# Patient Record
Sex: Female | Born: 1985
Health system: Southern US, Community
[De-identification: ages and names within clinical notes are randomized; demographics above are authoritative.]

## PROBLEM LIST (undated history)

## (undated) DIAGNOSIS — J45909 Unspecified asthma, uncomplicated: Secondary | ICD-10-CM

## (undated) DIAGNOSIS — J302 Other seasonal allergic rhinitis: Secondary | ICD-10-CM

## (undated) DIAGNOSIS — L0291 Cutaneous abscess, unspecified: Secondary | ICD-10-CM

## (undated) HISTORY — PX: TUBAL LIGATION: SHX77

---

## 2006-09-08 ENCOUNTER — Emergency Department (HOSPITAL_COMMUNITY): Admission: EM | Admit: 2006-09-08 | Discharge: 2006-09-08 | Payer: Self-pay | Admitting: Emergency Medicine

## 2006-09-11 ENCOUNTER — Inpatient Hospital Stay (HOSPITAL_COMMUNITY): Admission: AD | Admit: 2006-09-11 | Discharge: 2006-09-11 | Payer: Self-pay | Admitting: Obstetrics & Gynecology

## 2010-11-02 LAB — URINALYSIS, ROUTINE W REFLEX MICROSCOPIC
Glucose, UA: NEGATIVE
Ketones, ur: NEGATIVE
Leukocytes, UA: NEGATIVE
Nitrite: NEGATIVE
Protein, ur: 30 — AB
Specific Gravity, Urine: >1.03 — ABNORMAL HIGH
Urobilinogen, UA: 1
pH: 6

## 2010-11-02 LAB — CULTURE, ROUTINE-ABSCESS

## 2010-11-02 LAB — POCT PREGNANCY, URINE
Operator id: 26670
Operator id: 294501
Preg Test, Ur: POSITIVE
Preg Test, Ur: POSITIVE

## 2010-11-02 LAB — CBC
HCT: 40.6
Hemoglobin: 13.7
MCHC: 33.8
MCV: 80.2
Platelets: 194
RBC: 5.06
RDW: 14.6 — ABNORMAL HIGH
WBC: 4.4

## 2010-11-02 LAB — URINE MICROSCOPIC-ADD ON

## 2010-11-02 LAB — HCG, QUANTITATIVE, PREGNANCY: hCG, Beta Chain, Quant, S: 358 — ABNORMAL HIGH

## 2010-11-02 LAB — GC/CHLAMYDIA PROBE AMP, GENITAL
Chlamydia, DNA Probe: NEGATIVE
GC Probe Amp, Genital: NEGATIVE

## 2010-11-02 LAB — WET PREP, GENITAL
Clue Cells Wet Prep HPF POC: NONE SEEN
Trich, Wet Prep: NONE SEEN

## 2010-11-02 LAB — ABO/RH: ABO/RH(D): O POS

## 2017-11-09 ENCOUNTER — Other Ambulatory Visit: Payer: Self-pay

## 2017-11-09 ENCOUNTER — Encounter (HOSPITAL_BASED_OUTPATIENT_CLINIC_OR_DEPARTMENT_OTHER): Payer: Self-pay | Admitting: *Deleted

## 2017-11-09 ENCOUNTER — Emergency Department (HOSPITAL_BASED_OUTPATIENT_CLINIC_OR_DEPARTMENT_OTHER)
Admission: EM | Admit: 2017-11-09 | Discharge: 2017-11-09 | Disposition: A | Discharge status: Home / Self Care | Payer: Self-pay | Attending: Emergency Medicine | Admitting: Emergency Medicine

## 2017-11-09 DIAGNOSIS — N611 Abscess of the breast and nipple: Secondary | ICD-10-CM | POA: Insufficient documentation

## 2017-11-09 MED ORDER — SULFAMETHOXAZOLE-TRIMETHOPRIM 800-160 MG PO TABS: 1.0000 | ORAL_TABLET | Freq: Two times a day (BID) | ORAL | 0 refills | Status: AC

## 2017-11-09 NOTE — ED Notes (Signed)
Rx x 1 given for bactrim

## 2017-11-09 NOTE — ED Triage Notes (Signed)
Pt reports right breast swelling and tenderness x 1 week. States that she is on her period at this time. Denies discharge from nipple

## 2017-11-09 NOTE — ED Notes (Signed)
Right breast marked with marking pen, area of abscess, by PA

## 2017-11-09 NOTE — Discharge Instructions (Addendum)
You were seen in the ER for right breast pain.  There is some redness and swelling around the nipple.  Ultrasound showed a collection of fluid that could be blood or pus.  This could be from an abscess (boil) or an inflamed and infected duct in the breast.  Both are treated similarly.   You declined drainage today.  We will treat you with antibiotics.  600 mg ibuprofen every 6 hours, you can add 500 to 1000 mg of acetaminophen (Tylenol) every 6 hours for more pain control.  Warm compresses and light massage is very important to help with the abscess.  Monitor your symptoms, return to the ER if the redness has spread past the marker lines in 48 hours of taking antibiotics.  Return sooner if there is fevers greater than 100.54F, chills, nipple drainage.

## 2017-11-09 NOTE — ED Provider Notes (Signed)
[facility] EMERGENCY DEPARTMENT Provider Note   CSN: [id number] Arrival date & time: 11/09/17  1240     History   Chief Complaint Chief Complaint  Patient presents with  . Breast Pain    HPI Jacqueline Hill is a 32 y.o. female here for evaluation of right breast pain.  Onset 1 week ago, gradually worsening.  Associated with redness surrounding the nipple.  She denies fevers, chills, nipple discharge, trauma.  No recent pregnancy or breast-feeding.  She has been taken ibuprofen with mild relief.  Aggravated with direct palpation and wearing a bra.  HPI  History reviewed. No pertinent past medical history.  There are no active problems to display for this patient.   History reviewed. No pertinent surgical history.   OB History   None      Home Medications    Prior to Admission medications   Medication Sig Start Date End Date Taking? Authorizing Provider  sulfamethoxazole-trimethoprim (BACTRIM DS,SEPTRA DS) 800-160 MG tablet Take 1 tablet by mouth 2 (two) times daily for 7 days. 11/09/17 11/16/17  Liberty Handy, PA-C    Family History History reviewed. No pertinent family history.  Social History Social History   Tobacco Use  . Smoking status: Never Smoker  . Smokeless tobacco: Never Used  Substance Use Topics  . Alcohol use: Not Currently  . Drug use: Not Currently     Allergies   Patient has no known allergies.   Review of Systems Review of Systems  Respiratory:       Breast pain R  Skin: Positive for color change.  All other systems reviewed and are negative.    Physical Exam Updated Vital Signs BP 118/88 (BP Location: Left Arm)   Pulse 70   Temp 98.8 F (37.1 C) (Oral)   Resp 18   Ht 5\' 1"  (1.549 m)   Wt 45.4 kg   LMP 11/08/2017   SpO2 100%   BMI 18.89 kg/m   Physical Exam  Constitutional: She is oriented to person, place, and time. She appears well-developed and well-nourished. No distress.  NAD.  HENT:  Head:  Normocephalic and atraumatic.  Right Ear: External ear normal.  Left Ear: External ear normal.  Nose: Nose normal.  Eyes: Conjunctivae and EOM are normal. No scleral icterus.  Neck: Normal range of motion. Neck supple.  Cardiovascular: Normal rate, regular rhythm and normal heart sounds.  Pulmonary/Chest: Effort normal and breath sounds normal.  Musculoskeletal: Normal range of motion. She exhibits no deformity.  Neurological: She is alert and oriented to person, place, and time.  Skin: Skin is warm and dry. Capillary refill takes less than 2 seconds.  Mild erythema around upper part of right areola. approx 2 x 4 cm area of induration at 12 o'clock on areola.  Exquisite tenderness. No nipple discharge  Psychiatric: She has a normal mood and affect. Her behavior is normal. Judgment and thought content normal.  Nursing note and vitals reviewed.    ED Treatments / Results  Labs (all labs ordered are listed, but only abnormal results are displayed) Labs Reviewed - No data to display  EKG None  Radiology No results found.  Procedures Procedures (including critical care time)  EMERGENCY DEPARTMENT US SOFT TISSUE INTERPRETATION "Study: Limited Soft Tissue Ultrasound"  INDICATIONS: cellulitis Multiple views of the body part were obtained in real-time with a multi-frequency linear probe  PERFORMED BY: Myself IMAGES ARCHIVED?: Yes SIDE:Right  BODY PART:Breast INTERPRETATION:  Cellulitis present and fluid  Medications Ordered in ED Medications - No data to display   Initial Impression / Assessment and Plan / ED Course  I have reviewed the triage vital signs and the nursing notes.  Pertinent labs & imaging results that were available during my care of the patient were reviewed by me and considered in my medical decision making (see chart for details).     Concern for cellulitis versus abscess versus inflamed/infected duct.  She has no nipple discharge.  Malignancy is  less likely.  No systemic symptoms of infection such as fevers, chills.  Bedside ultrasound performed that suggested a collection of fluid at around 12:00 on the areola.  She has mild surrounding cellulitis as well.  Given location, I recommended needle aspiration.  I discussed the benefits and the risks of the procedure.  Patient ultimately declined because she was afraid of needles.  She understands that this may cause prolongation of infection or worsening infection.  I do not think emergent lab work or formal imaging is indicated today.  She is immunocompetent.  She is appropriate for outpatient management of small right breast abscess/cellulitis with oral antibiotics, warm compresses, high-dose NSAIDs.  I have marked the area of cellulitis, she is aware she needs to monitor the redness in the next 48 hours after antibiotics.  She is to return for fevers or expanding cellulitis, nipple discharge.  Patient is comfortable with this plan.  Final Clinical Impressions(s) / ED Diagnoses   Final diagnoses:  Breast abscess of female    ED Discharge Orders         Ordered    sulfamethoxazole-trimethoprim (BACTRIM DS,SEPTRA DS) 800-160 MG tablet  2 times daily     11/09/17 1427           Jerrell Mylar 11/09/17 1455    Linwood Dibbles, MD 11/10/17 2118

## 2018-03-21 ENCOUNTER — Other Ambulatory Visit: Payer: Self-pay

## 2018-03-21 ENCOUNTER — Encounter (HOSPITAL_BASED_OUTPATIENT_CLINIC_OR_DEPARTMENT_OTHER): Payer: Self-pay

## 2018-03-21 ENCOUNTER — Emergency Department (HOSPITAL_BASED_OUTPATIENT_CLINIC_OR_DEPARTMENT_OTHER)
Admission: EM | Admit: 2018-03-21 | Discharge: 2018-03-21 | Disposition: A | Discharge status: Home / Self Care | Payer: Self-pay | Attending: Emergency Medicine | Admitting: Emergency Medicine

## 2018-03-21 DIAGNOSIS — K029 Dental caries, unspecified: Secondary | ICD-10-CM | POA: Insufficient documentation

## 2018-03-21 DIAGNOSIS — K0889 Other specified disorders of teeth and supporting structures: Secondary | ICD-10-CM

## 2018-03-21 MED ORDER — PENICILLIN V POTASSIUM 500 MG PO TABS: 500.0000 mg | ORAL_TABLET | Freq: Four times a day (QID) | ORAL | 0 refills | Status: AC

## 2018-03-21 MED ORDER — HYDROCODONE-ACETAMINOPHEN 5-325 MG PO TABS
2.0000 | ORAL_TABLET | Freq: Once | ORAL | Status: AC
  Administered 2018-03-21: 2 via ORAL
  Filled 2018-03-21: qty 2

## 2018-03-21 MED ORDER — CHLORHEXIDINE GLUCONATE 0.12 % MT SOLN: 15.0000 mL | Freq: Two times a day (BID) | OROMUCOSAL | 0 refills | Status: DC

## 2018-03-21 MED ORDER — PENICILLIN V POTASSIUM 250 MG PO TABS
500.0000 mg | ORAL_TABLET | Freq: Once | ORAL | Status: AC
  Administered 2018-03-21: 500 mg via ORAL
  Filled 2018-03-21: qty 2

## 2018-03-21 NOTE — Discharge Instructions (Addendum)
You have been diagnosed today with Dental Pain.  At this time there does not appear to be the presence of an emergent medical condition, however there is always the potential for conditions to change. Please read and follow the below instructions.  Please return to the Emergency Department immediately for any new or worsening symptoms. Please be sure to follow up with your Primary Care Provider within one week regarding your visit today; please call their office to schedule an appointment even if you are feeling better for a follow-up visit. Please take the antibiotic penicillin as prescribed to avoid infection. You may use the Peridex mouth rinse as prescribed to help keep your mouth clean.  Do not swallow Peridex. You may take Ibuprofen (Advil, motrin) and Tylenol (acetaminophen) to relieve your pain.  You may take up to 400 MG (2 pills) of normal strength ibuprofen every 8 hours as needed.  In between doses of ibuprofen you make take tylenol, up to 500 mg (one extra strength pill).  Do not take more than 3,000 mg tylenol in a 24 hour period.  Please check all medication labels as many medications such as pain and cold medications may contain tylenol.  Do not drink alcohol while taking these medications.  Do not take other NSAID'S while taking ibuprofen (such as aleve or naproxen).  Please take ibuprofen with food to decrease stomach upset. You may use heating pads and ice packs to help with your dental pain. Please follow-up with a dentist as soon as possible for further evaluation and treatment of your dental pain.  Please see the resources below to find a dentist.  Get help right away if you: Are unable to open your mouth. Are having trouble breathing or swallowing. Have a fever. Notice that your face, neck, or jaw is swollen. Any new or concerning symptoms  Please read the additional information packets attached to your discharge summary.  Do not take your medicine if  develop an itchy  rash, swelling in your mouth or lips, or difficulty breathing.  ------------------------------------------ Dental Assistance  If unable to pay or uninsured, contact:  [facility] or [facility]. to become qualified for the adult dental clinic.  Patients with Medicaid: [facility] [phone] [address], [phone] [address], [phone]  If unable to pay, or uninsured, contact [facility] [phone]) or [facility] Department [phone] in [place], [phone] in [facility]) to become qualified for the adult dental clinic   Other Low-Cost Community Dental Services: [facility]- [address] [place], [place], [place], [address], [phone], Ext. [phone], 2nd and 4th Thursday of the month at 6:30am.  10 clients each day by appointment, can sometimes see walk-in patients if someone does not show for an appointment. [facility]- [address] [place], [place], [address], [phone] [facility] [address] [place], [place], [address], [phone] [facility]- [phone] [facility] Health Department- [phone] [facility] [phone]

## 2018-03-21 NOTE — ED Triage Notes (Signed)
Right lower dental pain since this morning.

## 2018-03-21 NOTE — ED Provider Notes (Signed)
[facility] EMERGENCY DEPARTMENT Provider Note   CSN: [id number] Arrival date & time: 03/21/18  1007    History   Chief Complaint Chief Complaint  Patient presents with  . Dental Pain    HPI Jacqueline Hill is a 33 y.o. female presenting today for right lower dental pain that began this morning.  Patient describes her pain as a severe intensity throbbing pain constant worsened with chewing on her right side and without alleviating factor.  Patient has not used any medication prior to arrival.  Patient denies history of fever, facial swelling, feeling of throat closing, difficulty breathing or swallowing, neck swelling or any additional concerns.   Patient states that she is an otherwise healthy 33 year old female without chronic medical conditions or immunocompromising diseases.  She states that her tubes are tied and denies chance of pregnancy today.    HPI  History reviewed. No pertinent past medical history.  There are no active problems to display for this patient.   History reviewed. No pertinent surgical history.   OB History   No obstetric history on file.      Home Medications    Prior to Admission medications   Medication Sig Start Date End Date Taking? Authorizing Provider  chlorhexidine (PERIDEX) 0.12 % solution Use as directed 15 mLs in the mouth or throat 2 (two) times daily. Do NOT swallow 03/21/18   Harlene Salts A, PA-C  penicillin v potassium (VEETID) 500 MG tablet Take 1 tablet (500 mg total) by mouth 4 (four) times daily for 7 days. 03/21/18 03/28/18  Bill Salinas, PA-C    Family History No family history on file.  Social History Social History   Tobacco Use  . Smoking status: Never Smoker  . Smokeless tobacco: Never Used  Substance Use Topics  . Alcohol use: Not Currently  . Drug use: Not Currently     Allergies   Patient has no known allergies.   Review of Systems Review of Systems  Constitutional: Negative.   Negative for chills and fever.  HENT: Positive for dental problem. Negative for drooling, facial swelling, sore throat, trouble swallowing and voice change.   Eyes: Negative.  Negative for pain and visual disturbance.  Gastrointestinal: Negative.  Negative for abdominal pain, nausea and vomiting.  Musculoskeletal: Negative.  Negative for neck pain and neck stiffness.   Physical Exam Updated Vital Signs BP 133/87 (BP Location: Left Arm)   Pulse 84   Temp 98.3 F (36.8 C) (Oral)   Resp 16   Ht 5\' 1"  (1.549 m)   Wt 50.3 kg   LMP 02/21/2018   SpO2 99%   BMI 20.97 kg/m   Physical Exam Constitutional:      General: She is not in acute distress.    Appearance: Normal appearance. She is well-developed. She is not ill-appearing or diaphoretic.  HENT:     Head: Normocephalic and atraumatic.     Jaw: There is normal jaw occlusion. No trismus.     Right Ear: Tympanic membrane, ear canal and external ear normal.     Left Ear: Tympanic membrane, ear canal and external ear normal.     Nose: Nose normal.     Mouth/Throat:     Mouth: Mucous membranes are moist.     Pharynx: Oropharynx is clear.      Comments: Multiple dental caries and poor dentition overall.  Patient's pain centers around tooth 29.  The patient has normal phonation and is in control of secretions. No stridor.  Midline uvula without edema. Soft palate rises symmetrically. No tonsillar erythema, swelling or exudates. Tongue protrusion is normal, floor of mouth is soft. No trismus. No creptius or swelling on neck palpation. No gingival erythema or fluctuance noted. Mucus membranes moist. Eyes:     General: Vision grossly intact. Gaze aligned appropriately.     Extraocular Movements: Extraocular movements intact.     Conjunctiva/sclera: Conjunctivae normal.     Pupils: Pupils are equal, round, and reactive to light.     Comments: No pain with extraocular motion  Neck:     Musculoskeletal: Full passive range of motion without  pain, normal range of motion and neck supple. No neck rigidity, crepitus or pain with movement.     Trachea: Trachea and phonation normal. No tracheal tenderness or tracheal deviation.  Pulmonary:     Effort: Pulmonary effort is normal. No respiratory distress.  Abdominal:     General: There is no distension.     Palpations: Abdomen is soft.     Tenderness: There is no abdominal tenderness. There is no guarding or rebound.  Musculoskeletal: Normal range of motion.  Skin:    General: Skin is warm and dry.  Neurological:     Mental Status: She is alert.     GCS: GCS eye subscore is 4. GCS verbal subscore is 5. GCS motor subscore is 6.     Comments: Speech is clear and goal oriented, follows commands Major Cranial nerves without deficit, no facial droop Moves extremities without ataxia, coordination intact Normal gait  Psychiatric:        Behavior: Behavior normal.    ED Treatments / Results  Labs (all labs ordered are listed, but only abnormal results are displayed) Labs Reviewed - No data to display  EKG None  Radiology No results found.  Procedures Procedures (including critical care time)  Medications Ordered in ED Medications  HYDROcodone-acetaminophen (NORCO/VICODIN) 5-325 MG per tablet 2 tablet (2 tablets Oral Given 03/21/18 1028)  penicillin v potassium (VEETID) tablet 500 mg (500 mg Oral Given 03/21/18 1029)     Initial Impression / Assessment and Plan / ED Course  I have reviewed the triage vital signs and the nursing notes.  Pertinent labs & imaging results that were available during my care of the patient were reviewed by me and considered in my medical decision making (see chart for details).     33 year old otherwise healthy female presents today for dental pain that began this morning.  Patient with poor dentition overall and multiple dental cavities.  Patient with pain to percussion of tooth 29.  Patient without signs or symptoms of dental abscess, no  swelling/erythema/tenderness of the gums.  Patient is overall very well-appearing, afebrile, nontoxic and speaking well.  She is able to swallow without pain.  No signs of swelling or concern for Ludwig's angina, peritonsillar abscess, retropharyngeal abscess or other deep tissue infections of the head or neck.  No pain with extraocular motion and no signs of preseptal or orbital cellulitis. No sign of swelling of the neck, patient has good range of motion of the neck, no trismus.  Patient's husband is here to drive her home today.  She has been given 2 Norco here in the emergency department for pain. She has been informed not to drive after pain medicine today.  Will treat with penicillin, Peridex and OTC anti-inflammatories.  Patient urged to follow-up with a dentist.  At this time there does not appear to be any evidence of an acute  emergency medical condition and the patient appears stable for discharge with appropriate outpatient follow up. Diagnosis was discussed with patient who verbalizes understanding of care plan and is agreeable to discharge. I have discussed return precautions with patient and husband who verbalize understanding of return precautions. Patient strongly encouraged to follow-up with their PCP and dentis. All questions answered.   Note: Portions of this report may have been transcribed using voice recognition software. Every effort was made to ensure accuracy; however, inadvertent computerized transcription errors may still be present.  Final Clinical Impressions(s) / ED Diagnoses   Final diagnoses:  Pain, dental    ED Discharge Orders         Ordered    penicillin v potassium (VEETID) 500 MG tablet  4 times daily     03/21/18 1032    chlorhexidine (PERIDEX) 0.12 % solution  2 times daily     03/21/18 [address] 03/21/18 1037    Pricilla Loveless, MD 03/21/18 1407

## 2018-03-21 NOTE — ED Notes (Signed)
ED Provider at bedside. 

## 2018-09-14 ENCOUNTER — Encounter (HOSPITAL_BASED_OUTPATIENT_CLINIC_OR_DEPARTMENT_OTHER): Payer: Self-pay | Admitting: *Deleted

## 2018-09-14 ENCOUNTER — Emergency Department (HOSPITAL_BASED_OUTPATIENT_CLINIC_OR_DEPARTMENT_OTHER)
Admission: EM | Admit: 2018-09-14 | Discharge: 2018-09-14 | Disposition: A | Discharge status: Home / Self Care | Payer: Medicaid Other | Attending: Emergency Medicine | Admitting: Emergency Medicine

## 2018-09-14 ENCOUNTER — Other Ambulatory Visit: Payer: Self-pay

## 2018-09-14 DIAGNOSIS — M545 Low back pain, unspecified: Secondary | ICD-10-CM

## 2018-09-14 DIAGNOSIS — F17210 Nicotine dependence, cigarettes, uncomplicated: Secondary | ICD-10-CM | POA: Insufficient documentation

## 2018-09-14 LAB — URINALYSIS, ROUTINE W REFLEX MICROSCOPIC
Bilirubin Urine: NEGATIVE
Glucose, UA: NEGATIVE mg/dL
Hgb urine dipstick: NEGATIVE
Ketones, ur: NEGATIVE mg/dL
Leukocytes,Ua: NEGATIVE
Nitrite: NEGATIVE
Protein, ur: NEGATIVE mg/dL
Specific Gravity, Urine: 1.02 (ref 1.005–1.030)
pH: 7 (ref 5.0–8.0)

## 2018-09-14 LAB — PREGNANCY, URINE: Preg Test, Ur: NEGATIVE

## 2018-09-14 MED ORDER — METHOCARBAMOL 500 MG PO TABS: 500.0000 mg | ORAL_TABLET | Freq: Two times a day (BID) | ORAL | 0 refills | Status: DC

## 2018-09-14 MED FILL — METHOCARBAMOL 500 MG TABS: 500 | 8 days supply | Qty: 16 | Fill #0

## 2018-09-14 NOTE — ED Provider Notes (Signed)
[facility] EMERGENCY DEPARTMENT Provider Note   CSN: [id number] Arrival date & time: 09/14/18  1058     History   Chief Complaint Chief Complaint  Patient presents with   Back Pain    HPI Jacqueline Hill is a 33 y.o. female who presents for evaluation of right-sided back pain x3 days.  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  She does report that on Friday, prior to onset of pain, she did try and lift a heavy pallet by herself.  She reports since then, she has had pain to the right lower back.  This pain is worse with movement, bending down.  She has been able to ambulate but does report some worsening pain with ambulation.  She reports occasionally, this pain will radiate down into her buttock and leg.  She denies any numbness/weakness of her extremities.  She states that she is concerned she may have a "kidney infection or a urine infection" because her urine smelled.  She states she did not have any dysuria or hematuria that she is noted.  She did have some nausea and mild lower abdominal pain this morning but denies any vomiting.  She currently denies any abdominal pain at this time. Denies fevers, weight loss, numbness/weakness of upper and lower extremities, bowel/bladder incontinence, saddle anesthesia, history of back surgery, history of IVDA.      The history is provided by the patient.    History reviewed. No pertinent past medical history.  There are no active problems to display for this patient.   History reviewed. No pertinent surgical history.   OB History   No obstetric history on file.      Home Medications    Prior to Admission medications   Medication Sig Start Date End Date Taking? Authorizing Provider  chlorhexidine (PERIDEX) 0.12 % solution Use as directed 15 mLs in the mouth or throat 2 (two) times daily. Do NOT swallow 03/21/18   Harlene SaltsMorelli, Brandon A, PA-C    methocarbamol (ROBAXIN) 500 MG tablet Take 1 tablet (500 mg total) by mouth 2 (two) times daily. 09/14/18   Maxwell CaulLayden, Jolly Bleicher A, PA-C    Family History No family history on file.  Social History Social History   Tobacco Use   Smoking status: Current Every Day Smoker   Smokeless tobacco: Never Used  Substance Use Topics   Alcohol use: Yes   Drug use: Yes    Types: Marijuana     Allergies   Patient has no known allergies.   Review of Systems Review of Systems  Constitutional: Negative for fever.  Genitourinary: Negative for dysuria and hematuria.  Musculoskeletal: Positive for back pain.  Neurological: Negative for weakness and numbness.  All other systems reviewed and are negative.    Physical Exam Updated Vital Signs BP 124/82 (BP Location: Right Arm)    Pulse 79    Temp 98.1 F (36.7 C) (Oral)    Resp 16    Ht 5\' 1"  (1.549 m)    Wt 57.6 kg    SpO2 100%    BMI 24.00 kg/m   Physical Exam Vitals signs and nursing note reviewed.  Constitutional:      Appearance: Normal appearance. She is well-developed.  HENT:     Head: Normocephalic and atraumatic.  Eyes:     General: Lids are normal.     Conjunctiva/sclera: Conjunctivae normal.     Pupils: Pupils are  equal, round, and reactive to light.  Neck:     Musculoskeletal: Full passive range of motion without pain.     Comments: Full flexion/extension and lateral movement of neck fully intact. No bony midline tenderness. No deformities or crepitus.  Cardiovascular:     Rate and Rhythm: Normal rate and regular rhythm.     Pulses: Normal pulses.     Heart sounds: Normal heart sounds. No murmur. No friction rub. No gallop.   Pulmonary:     Effort: Pulmonary effort is normal.  Abdominal:     Palpations: Abdomen is soft. Abdomen is not rigid.     Tenderness: There is no abdominal tenderness. There is no right CVA tenderness, left CVA tenderness or guarding.     Comments: Abdomen is soft, non-distended, non-tender. No  rigidity, No guarding. No peritoneal signs.  No CVA tenderness bilaterally.  Musculoskeletal: Normal range of motion.     Thoracic back: She exhibits no tenderness.     Lumbar back: She exhibits no tenderness.       Back:     Comments: No midline T or L-spine tenderness.  No deformity or crepitus noted.  Diffuse tenderness palpation noted to the paraspinal muscles of the right lower lumbar region.  Skin:    General: Skin is warm and dry.     Capillary Refill: Capillary refill takes less than 2 seconds.  Neurological:     Mental Status: She is alert and oriented to person, place, and time.     Comments: Follows commands, Moves all extremities  5/5 strength to BUE and BLE  Sensation intact throughout all major nerve distributions Negative straight leg raise.  Psychiatric:        Speech: Speech normal.      ED Treatments / Results  Labs (all labs ordered are listed, but only abnormal results are displayed) Labs Reviewed  URINALYSIS, ROUTINE W REFLEX MICROSCOPIC  PREGNANCY, URINE    EKG None  Radiology No results found.  Procedures Procedures (including critical care time)  Medications Ordered in ED Medications - No data to display   Initial Impression / Assessment and Plan / ED Course  I have reviewed the triage vital signs and the nursing notes.  Pertinent labs & imaging results that were available during my care of the patient were reviewed by me and considered in my medical decision making (see chart for details).        33 year old female who presents for evaluation of right lower back pain x3 days.  No history of trauma, injury but does do heavy lifting at work.  No dysuria or hematuria though has noted her urine smelled.  No fevers, vomiting.  Had mild episode of nausea and abdominal pain earlier today but that has since resolved. Patient is afebrile, non-toxic appearing, sitting comfortably on examination table. Vital signs reviewed and stable.  On exam, she  has tenderness palpation of the right paraspinal muscles of the lower lumbar region.  No midline bony tenderness.  No abdominal tenderness.  No neuro deficits on exam.  No red flags.  Exam consistent with musculoskeletal pain of her back.  History/physical exam not concerning for kidney stone.  Additionally, she has no abdominal tenderness with concerning for intra-abdominal process. History/physical exam is not concerning for cauda equina or spinal abscess.  Urine ordered at triage.  UA shows no evidence of infectious etiology.  Urine pregnancy negative.  This time, I believe her pain is most likely musculoskeletal in nature.  Will plan  to treat accordingly.  Patient with no known drug allergies. At this time, patient exhibits no emergent life-threatening condition that require further evaluation in ED or admission. Patient had ample opportunity for questions and discussion. All patient's questions were answered with full understanding. Strict return precautions discussed. Patient expresses understanding and agreement to plan.   Portions of this note were generated with Lobbyist. Dictation errors may occur despite best attempts at proofreading.   Final Clinical Impressions(s) / ED Diagnoses   Final diagnoses:  Acute right-sided low back pain, unspecified whether sciatica present    ED Discharge Orders         Ordered    methocarbamol (ROBAXIN) 500 MG tablet  2 times daily     09/14/18 1348           Desma Mcgregor 09/14/18 1906    Sherwood Gambler, MD 09/15/18 [phone]

## 2018-09-14 NOTE — Discharge Instructions (Signed)
You can take Tylenol or Ibuprofen as directed for pain. You can alternate Tylenol and Ibuprofen every 4 hours. If you take Tylenol at 1pm, then you can take Ibuprofen at 5pm. Then you can take Tylenol again at 9pm.   Take Robaxin as prescribed. This medication will make you drowsy so do not drive or drink alcohol when taking it.  Return to the Emergency Department immediately for any worsening back pain, neck pain, difficulty walking, numbness/weaknss of your arms or legs, urinary or bowel accidents, fever or any other worsening or concerning symptoms.  

## 2018-09-14 NOTE — ED Notes (Signed)
C/o rt lower back pain x 3 days  Denies inj  Pain increased w movement

## 2018-09-14 NOTE — ED Triage Notes (Signed)
Back pain x 3 days 

## 2018-12-19 ENCOUNTER — Other Ambulatory Visit: Payer: Self-pay

## 2018-12-19 ENCOUNTER — Emergency Department (HOSPITAL_BASED_OUTPATIENT_CLINIC_OR_DEPARTMENT_OTHER)
Admission: EM | Admit: 2018-12-19 | Discharge: 2018-12-19 | Disposition: A | Discharge status: Home / Self Care | Payer: Medicaid Other

## 2019-02-08 ENCOUNTER — Emergency Department (HOSPITAL_COMMUNITY)
Admission: EM | Admit: 2019-02-08 | Discharge: 2019-02-08 | Disposition: A | Discharge status: Home / Self Care | Payer: Medicaid Other

## 2019-03-09 ENCOUNTER — Emergency Department (HOSPITAL_COMMUNITY)
Admission: EM | Admit: 2019-03-09 | Discharge: 2019-03-09 | Disposition: A | Discharge status: Home / Self Care | Payer: Medicaid Other | Attending: Emergency Medicine | Admitting: Emergency Medicine

## 2019-03-09 ENCOUNTER — Other Ambulatory Visit: Payer: Self-pay

## 2019-03-09 DIAGNOSIS — F17210 Nicotine dependence, cigarettes, uncomplicated: Secondary | ICD-10-CM | POA: Insufficient documentation

## 2019-03-09 DIAGNOSIS — Z79899 Other long term (current) drug therapy: Secondary | ICD-10-CM | POA: Insufficient documentation

## 2019-03-09 DIAGNOSIS — R112 Nausea with vomiting, unspecified: Secondary | ICD-10-CM

## 2019-03-09 LAB — URINALYSIS, ROUTINE W REFLEX MICROSCOPIC
Glucose, UA: NEGATIVE mg/dL
Hgb urine dipstick: NEGATIVE
Ketones, ur: NEGATIVE mg/dL
Leukocytes,Ua: NEGATIVE
Nitrite: POSITIVE — AB
Protein, ur: NEGATIVE mg/dL
Specific Gravity, Urine: >1.03 — ABNORMAL HIGH (ref 1.005–1.030)
pH: 6 (ref 5.0–8.0)

## 2019-03-09 LAB — URINALYSIS, MICROSCOPIC (REFLEX)

## 2019-03-09 LAB — POC URINE PREG, ED: Preg Test, Ur: NEGATIVE

## 2019-03-09 MED ORDER — ONDANSETRON HCL 4 MG PO TABS: 4.0000 mg | ORAL_TABLET | Freq: Four times a day (QID) | ORAL | 0 refills | Status: DC

## 2019-03-09 MED ORDER — ONDANSETRON HCL 4 MG PO TABS: 4.0000 mg | ORAL_TABLET | Freq: Once | ORAL | Status: DC

## 2019-03-09 NOTE — Discharge Instructions (Signed)
Based on review of vitals, medical screening exam, lab work and/or imaging, there does not appear to be an acute, emergent etiology for the patient's symptoms. Counseled pt on good return precautions and encouraged both PCP and ED follow-up as needed.  Prior to discharge, I also discussed incidental imaging findings with patient in detail and advised appropriate, recommended follow-up in detail.  Clinical Impression: 1. Non-intractable vomiting with nausea, unspecified vomiting type     Disposition: Discharge  Prior to providing a prescription for a controlled substance, I independently reviewed the patient's recent prescription history on the [place] Controlled Substance Reporting System. The patient had no recent or regular prescriptions and was deemed appropriate for a brief, less than 3 day prescription of narcotic for acute analgesia.  This note was prepared with assistance of Conservation officer, historic buildings. Occasional wrong-word or sound-a-like substitutions may have occurred due to the inherent limitations of voice recognition software.

## 2019-03-09 NOTE — ED Provider Notes (Signed)
[facility]-EMERGENCY DEPT Provider Note   CSN: [id number] Arrival date & time: 03/09/19  1143     History No chief complaint on file.   Jacqueline Hill is a 34 y.o. female.  Patient is a 34 year old female with no past medical history presenting to the emergency department for pregnancy test.  Patient reports that she missed her period this month and she has had vomiting x5 since yesterday.  She denies any other symptoms.  Denies any abdominal pain, dysuria, vaginal discharge, pelvic pain, diarrhea, fever, cough or other URI symptoms.  Denies sick contacts.  Patient reports that she has had a tubal ligation in the past.        No past medical history on file.  There are no problems to display for this patient.   No past surgical history on file.   OB History   No obstetric history on file.     No family history on file.  Social History   Tobacco Use  . Smoking status: Current Every Day Smoker  . Smokeless tobacco: Never Used  Substance Use Topics  . Alcohol use: Yes  . Drug use: Yes    Types: Marijuana    Home Medications Prior to Admission medications   Medication Sig Start Date End Date Taking? Authorizing Provider  chlorhexidine (PERIDEX) 0.12 % solution Use as directed 15 mLs in the mouth or throat 2 (two) times daily. Do NOT swallow 03/21/18   Harlene Salts A, PA-C  methocarbamol (ROBAXIN) 500 MG tablet Take 1 tablet (500 mg total) by mouth 2 (two) times daily. 09/14/18   Maxwell Caul, PA-C    Allergies    Patient has no known allergies.  Review of Systems   Review of Systems  Constitutional: Negative for appetite change, chills, fatigue and fever.  HENT: Negative for congestion and sore throat.   Respiratory: Negative for cough and shortness of breath.   Cardiovascular: Negative for chest pain.  Gastrointestinal: Positive for nausea and vomiting. Negative for abdominal pain, constipation and diarrhea.  Genitourinary:  Positive for menstrual problem. Negative for decreased urine volume, dysuria, genital sores, pelvic pain, vaginal bleeding and vaginal discharge.  Musculoskeletal: Negative for arthralgias and myalgias.  Skin: Negative for rash.  Neurological: Negative for dizziness and light-headedness.    Physical Exam Updated Vital Signs BP 131/89 (BP Location: Left Arm)   Pulse (!) 110   Temp 98.4 F (36.9 C) (Oral)   Resp 17   Ht 5\' 1"  (1.549 m)   Wt 59 kg   LMP 02/07/2019   SpO2 100%   BMI 24.56 kg/m   Physical Exam Vitals and nursing note reviewed.  Constitutional:      General: She is not in acute distress.    Appearance: Normal appearance. She is not ill-appearing, toxic-appearing or diaphoretic.  HENT:     Head: Normocephalic.     Nose: Nose normal.     Mouth/Throat:     Mouth: Mucous membranes are moist.  Eyes:     Conjunctiva/sclera: Conjunctivae normal.  Cardiovascular:     Rate and Rhythm: Normal rate and regular rhythm.  Pulmonary:     Effort: Pulmonary effort is normal.     Breath sounds: Normal breath sounds.  Abdominal:     General: Abdomen is flat. There is no distension.     Palpations: There is no mass.     Tenderness: There is no abdominal tenderness. There is no right CVA tenderness, left CVA tenderness or guarding.  Skin:    General: Skin is dry.  Neurological:     Mental Status: She is alert.  Psychiatric:        Mood and Affect: Mood normal.     ED Results / Procedures / Treatments   Labs (all labs ordered are listed, but only abnormal results are displayed) Labs Reviewed  POC URINE PREG, ED    EKG None  Radiology No results found.  Procedures Procedures (including critical care time)  Medications Ordered in ED Medications  ondansetron (ZOFRAN) tablet 4 mg (has no administration in time range)    ED Course  I have reviewed the triage vital signs and the nursing notes.  Pertinent labs & imaging results that were available during my  care of the patient were reviewed by me and considered in my medical decision making (see chart for details).  Clinical Course as of Mar 08 1233  Tue Mar 09, 2019  1232 Patient with symptoms consistent with viral gastroenteritis.  Vitals are stable, no fever.  No signs of dehydration, tolerating PO fluids > 6 oz.  Lungs are clear.  No focal abdominal pain, no concern for appendicitis, cholecystitis, pancreatitis, ruptured viscus, UTI, kidney stone, or any other abdominal etiology.  Supportive therapy indicated with return if symptoms worsen.  Patient counseled.    [KM]    Clinical Course User Index [KM] Kristine Royal   MDM Rules/Calculators/A&P                      Based on review of vitals, medical screening exam, lab work and/or imaging, there does not appear to be an acute, emergent etiology for the patient's symptoms. Counseled pt on good return precautions and encouraged both PCP and ED follow-up as needed.  Prior to discharge, I also discussed incidental imaging findings with patient in detail and advised appropriate, recommended follow-up in detail.  Clinical Impression: 1. Non-intractable vomiting with nausea, unspecified vomiting type     Disposition: Discharge  Prior to providing a prescription for a controlled substance, I independently reviewed the patient's recent prescription history on the [place]. The patient had no recent or regular prescriptions and was deemed appropriate for a brief, less than 3 day prescription of narcotic for acute analgesia.  This note was prepared with assistance of Systems analyst. Occasional wrong-word or sound-a-like substitutions may have occurred due to the inherent limitations of voice recognition software.  Final Clinical Impression(s) / ED Diagnoses Final diagnoses:  None    Rx / DC Orders ED Discharge Orders    None       Kristine Royal 03/09/19  1235    Nat Christen, MD 03/09/19 [phone]

## 2019-03-09 NOTE — ED Triage Notes (Addendum)
Per patient, she has missed her period this month and has never missed a period, patient says she is afraid she may be pregnant. Says her tubes were tied in 2013, but she has never missed a period. Reports uterus pain of 7/10. Patient says she has been vomiting since last night.

## 2019-03-28 ENCOUNTER — Emergency Department (HOSPITAL_COMMUNITY)
Admission: EM | Admit: 2019-03-28 | Discharge: 2019-03-28 | Disposition: A | Discharge status: Home / Self Care | Payer: Medicaid Other | Attending: Emergency Medicine | Admitting: Emergency Medicine

## 2019-03-28 ENCOUNTER — Encounter (HOSPITAL_COMMUNITY): Payer: Self-pay | Admitting: *Deleted

## 2019-03-28 DIAGNOSIS — F17200 Nicotine dependence, unspecified, uncomplicated: Secondary | ICD-10-CM | POA: Insufficient documentation

## 2019-03-28 DIAGNOSIS — N611 Abscess of the breast and nipple: Secondary | ICD-10-CM

## 2019-03-28 HISTORY — DX: Cutaneous abscess, unspecified: L02.91

## 2019-03-28 MED ORDER — CEPHALEXIN 500 MG PO CAPS: 500.0000 mg | ORAL_CAPSULE | Freq: Four times a day (QID) | ORAL | 0 refills | Status: AC

## 2019-03-28 NOTE — ED Triage Notes (Signed)
Abscess on rt breast open and draining.

## 2019-03-28 NOTE — ED Provider Notes (Signed)
[place] DEPT Provider Note   CSN: [id number] Arrival date & time: 03/28/19  1119     History Chief Complaint  Patient presents with  . Abscess    Jacqueline Hill is a 34 y.o. female.  HPI   34 year old female presenting for evaluation of abscess to the right breast.  States that it has been present for the last several months however this morning she woke up and noticed that she had had some drainage.  She denies any associated symptoms including no fevers.  Denies any exacerbating or alleviating factors.  Past Medical History:  Diagnosis Date  . Abscess     There are no problems to display for this patient.   History reviewed. No pertinent surgical history.   OB History   No obstetric history on file.     No family history on file.  Social History   Tobacco Use  . Smoking status: Current Every Day Smoker  . Smokeless tobacco: Never Used  Substance Use Topics  . Alcohol use: Yes  . Drug use: Never    Types: Marijuana    Home Medications Prior to Admission medications   Medication Sig Start Date End Date Taking? Authorizing Provider  cephALEXin (KEFLEX) 500 MG capsule Take 1 capsule (500 mg total) by mouth 4 (four) times daily for 10 days. 03/28/19 04/07/19  Claron Rosencrans S, PA-C  chlorhexidine (PERIDEX) 0.12 % solution Use as directed 15 mLs in the mouth or throat 2 (two) times daily. Do NOT swallow 03/21/18   Nuala Alpha A, PA-C  methocarbamol (ROBAXIN) 500 MG tablet Take 1 tablet (500 mg total) by mouth 2 (two) times daily. 09/14/18   Volanda Napoleon, PA-C  ondansetron (ZOFRAN) 4 MG tablet Take 1 tablet (4 mg total) by mouth every 6 (six) hours. 03/09/19   Alveria Apley, PA-C    Allergies    Patient has no known allergies.  Review of Systems   Review of Systems  Constitutional: Negative for fever.  Respiratory: Negative for shortness of breath.   Cardiovascular: Negative for chest pain.  Skin: Positive for color  change.       Right breast abscess    Physical Exam Updated Vital Signs BP (!) 130/107 (BP Location: Left Arm)   Pulse 95   Temp 98.3 F (36.8 C) (Oral)   Resp 18   Ht 5\' 1"  (1.549 m)   SpO2 98%   BMI 24.56 kg/m   Physical Exam Vitals and nursing note reviewed.  Constitutional:      General: She is not in acute distress.    Appearance: She is well-developed.  HENT:     Head: Normocephalic and atraumatic.  Eyes:     Conjunctiva/sclera: Conjunctivae normal.  Cardiovascular:     Rate and Rhythm: Normal rate.  Pulmonary:     Effort: Pulmonary effort is normal.  Chest:     Breasts:        Right: No inverted nipple or nipple discharge.        Comments: Mild erythema, warmth and TTP noted above the right areola. There is a central area of fluctuance and scabbing noted. There is not drainage noted. Musculoskeletal:        General: Normal range of motion.     Cervical back: Neck supple.  Skin:    General: Skin is warm and dry.  Neurological:     Mental Status: She is alert.     ED Results / Procedures / Treatments  Labs (all labs ordered are listed, but only abnormal results are displayed) Labs Reviewed - No data to display  EKG None  Radiology No results found.  Procedures Procedures (including critical care time)  Medications Ordered in ED Medications - No data to display  ED Course  I have reviewed the triage vital signs and the nursing notes.  Pertinent labs & imaging results that were available during my care of the patient were reviewed by me and considered in my medical decision making (see chart for details).    MDM Rules/Calculators/A&P                      34 y/o F presenting for eval of breast abscess. No systemic sxs. Afebrile and nontoxic appearing here. Will start on abx. She does not have a pcp, will given information for her to go to the [facility] so that she can be referred to the breast center for imaging and  possible aspiration. I have started her on abx. Have advised her to f/u for new or worsening symptoms. Pt voices understanding and is in agreement with the plan. All questions answered, pt stable for d/c.   Final Clinical Impression(s) / ED Diagnoses Final diagnoses:  Breast abscess    Rx / DC Orders ED Discharge Orders         Ordered    cephALEXin (KEFLEX) 500 MG capsule  4 times daily     03/28/19 [address] Byanca Kasper S, PA-C 03/28/19 1157    Gwyneth Sprout, MD 03/28/19 1511

## 2019-03-28 NOTE — Discharge Instructions (Signed)
You were given a prescription for antibiotics. Please take the antibiotic prescription fully.   You will need to go to the [facility] to establish care and be referred to the Breast Center. Please go to the office tomorrow morning.  Please return to the emergency room immediately if you experience any new or worsening symptoms or any symptoms that indicate worsening infection such as fevers, increased redness/swelling/pain, warmth, or drainage from the affected area.

## 2019-03-29 ENCOUNTER — Ambulatory Visit (HOSPITAL_COMMUNITY)
Admission: EM | Admit: 2019-03-29 | Discharge: 2019-03-29 | Disposition: A | Discharge status: Home / Self Care | Payer: Medicaid Other | Attending: Family Medicine | Admitting: Family Medicine

## 2019-03-29 ENCOUNTER — Other Ambulatory Visit: Payer: Self-pay

## 2019-03-29 ENCOUNTER — Encounter (HOSPITAL_COMMUNITY): Payer: Self-pay | Admitting: Emergency Medicine

## 2019-03-29 DIAGNOSIS — N611 Abscess of the breast and nipple: Secondary | ICD-10-CM

## 2019-03-29 NOTE — Discharge Instructions (Addendum)
You must establish with a primary care doctor The primary care doctor can refer you to the breast center You cannot get referrals from urgent care or ER ( per medicaid) I have given you two numbers to call

## 2019-03-29 NOTE — ED Triage Notes (Signed)
Abscess on right breast. She was seen at [facility] yesterday and started on antibiotics. She feels like it needs I&D

## 2019-03-29 NOTE — ED Provider Notes (Signed)
Erma    CSN: [id number] Arrival date & time: 03/29/19  1002      History   Chief Complaint Chief Complaint  Patient presents with  . Abscess    HPI Jacqueline Hill is a 34 y.o. female.   HPI  Patient is here for her right breast abscess She was seen yesterday in the emergency department She was referred to primary care so she can go to the breast center for additional imaging She called the primary care and cannot be seen for a week, was discouraged, and felt like she needed care today She is taking the antibiotics She is having very little pain I reviewed her medical record.  She was first seen for an abscess in this exact area in october of 2019.  By her report this is the same problem.  I explained to the patient that same-day appointments were appropriate for emergencies, and that this is clearly a chronic problem. I also explained to her that ignoring this is not advisable.  She has a mass with drainage and dimpling that is been present for a long time.  She needs to get it taken care of.  Past Medical History:  Diagnosis Date  . Abscess     There are no problems to display for this patient.   History reviewed. No pertinent surgical history.  OB History   No obstetric history on file.      Home Medications    Prior to Admission medications   Medication Sig Start Date End Date Taking? Authorizing Provider  cephALEXin (KEFLEX) 500 MG capsule Take 1 capsule (500 mg total) by mouth 4 (four) times daily for 10 days. 03/28/19 04/07/19 Yes Couture, Cortni S, PA-C  chlorhexidine (PERIDEX) 0.12 % solution Use as directed 15 mLs in the mouth or throat 2 (two) times daily. Do NOT swallow 03/21/18   Nuala Alpha A, PA-C  methocarbamol (ROBAXIN) 500 MG tablet Take 1 tablet (500 mg total) by mouth 2 (two) times daily. 09/14/18   Volanda Napoleon, PA-C  ondansetron (ZOFRAN) 4 MG tablet Take 1 tablet (4 mg total) by mouth every 6 (six) hours. 03/09/19    Alveria Apley, PA-C    Family History No family history on file.  Social History Social History   Tobacco Use  . Smoking status: Current Every Day Smoker  . Smokeless tobacco: Never Used  Substance Use Topics  . Alcohol use: Yes  . Drug use: Never    Types: Marijuana     Allergies   Patient has no known allergies.   Review of Systems Review of Systems  Skin: Positive for wound.     Physical Exam Triage Vital Signs ED Triage Vitals  Enc Vitals Group     BP 03/29/19 1052 (!) 120/91     Pulse Rate 03/29/19 1051 94     Resp 03/29/19 1051 16     Temp 03/29/19 1051 98.3 F (36.8 C)     Temp Source 03/29/19 1051 Oral     SpO2 03/29/19 1051 100 %     Weight --      Height --      Head Circumference --      Peak Flow --      Pain Score 03/29/19 1051 3     Pain Loc --      Pain Edu? --      Excl. in [place]? --    No data found.  Updated Vital Signs BP Marland Kitchen)  120/91   Pulse 94   Temp 98.3 F (36.8 C) (Oral)   Resp 16   LMP 03/18/2019   SpO2 100%      Physical Exam Constitutional:      General: She is not in acute distress.    Appearance: She is well-developed.  HENT:     Head: Normocephalic and atraumatic.  Eyes:     Conjunctiva/sclera: Conjunctivae normal.     Pupils: Pupils are equal, round, and reactive to light.  Cardiovascular:     Rate and Rhythm: Normal rate.  Pulmonary:     Effort: Pulmonary effort is normal. No respiratory distress.  Chest:    Abdominal:     General: There is no distension.     Palpations: Abdomen is soft.  Musculoskeletal:        General: Normal range of motion.     Cervical back: Normal range of motion.  Skin:    General: Skin is warm and dry.  Neurological:     Mental Status: She is alert.      UC Treatments / Results  Labs (all labs ordered are listed, but only abnormal results are displayed) Labs Reviewed - No data to display  EKG   Radiology No results found.  Procedures Procedures (including  critical care time)  Medications Ordered in UC Medications - No data to display  Initial Impression / Assessment and Plan / UC Course  I have reviewed the triage vital signs and the nursing notes.  Pertinent labs & imaging results that were available during my care of the patient were reviewed by me and considered in my medical decision making (see chart for details).     I told the patient that although this is unlikely to be a malignancy, it is not prudent to ignore a breast finding for over a year.  I encouraged her to see her primary care doctor for referral to the breast center. Final Clinical Impressions(s) / UC Diagnoses   Final diagnoses:  Breast abscess of female     Discharge Instructions     You must establish with a primary care doctor The primary care doctor can refer you to the breast center You cannot get referrals from urgent care or ER ( per medicaid) I have given you two numbers to call   ED Prescriptions    None     PDMP not reviewed this encounter.   Eustace Moore, MD 03/29/19 1118

## 2019-04-07 NOTE — Progress Notes (Signed)
Patient ID: Jacqueline Hill, female   DOB: 11-28-1985, 34 y.o.   MRN: [id number] Virtual Visit via Telephone Note  I connected with Clarene Reamer on 04/08/19 at  2:30 PM EDT by telephone and verified that I am speaking with the correct person using two identifiers.   I discussed the limitations, risks, security and privacy concerns of performing an evaluation and management service by telephone and the availability of in person appointments. I also discussed with the patient that there may be a patient responsible charge related to this service. The patient expressed understanding and agreed to proceed.  PATIENT visit by telephone virtually in the context of Covid-19 pandemic. Patient location: home My Location:  [facility] office Persons on the call:  Me and the patient   History of Present Illness: Seen in ED 03/29/2019 for breast abscess of R breast.  Placed on cephalexin.  Area on her R breast is improving.  She feels like she might be getting a yeast infection and would like a diflucan.  Also wants me to send an rx for claritin to her pharmacy.  No insurance.  Wants to proceed with [facility] or [facility].  No FH breast CA, but there are other cancers in her family although she does not know specifics.  She had not noticed any lump or mass prior to this episode.      Observations/Objective:  NAD.  A&Ox3   Assessment and Plan: 1. Yeast infection S/p antibiotics - fluconazole (DIFLUCAN) 150 MG tablet; Take 1 tablet (150 mg total) by mouth once for 1 dose.  Dispense: 1 tablet; Refill: 0  2. Allergy, initial encounter - loratadine (CLARITIN) 10 MG tablet; Take 1 tablet (10 mg total) by mouth daily.  Dispense: 30 tablet; Refill: 11  3. Encounter for examination following treatment at hospital Doing well  4. Abscess of right breast SBE encouraged monthly after menses - MM Digital Diagnostic Unilat R; Future    Follow Up Instructions: Assign PCP ~6 weeks   I discussed the assessment and treatment plan  with the patient. The patient was provided an opportunity to ask questions and all were answered. The patient agreed with the plan and demonstrated an understanding of the instructions.   The patient was advised to call back or seek an in-person evaluation if the symptoms worsen or if the condition fails to improve as anticipated.  I provided 9 minutes of non-face-to-face time during this encounter.   Georgian Co, PA-C

## 2019-04-08 ENCOUNTER — Ambulatory Visit: Payer: Self-pay | Attending: Family Medicine | Admitting: Physician Assistant

## 2019-04-08 ENCOUNTER — Other Ambulatory Visit: Payer: Self-pay

## 2019-04-08 DIAGNOSIS — B379 Candidiasis, unspecified: Secondary | ICD-10-CM

## 2019-04-08 DIAGNOSIS — N611 Abscess of the breast and nipple: Secondary | ICD-10-CM

## 2019-04-08 DIAGNOSIS — T7840XA Allergy, unspecified, initial encounter: Secondary | ICD-10-CM

## 2019-04-08 DIAGNOSIS — Z09 Encounter for follow-up examination after completed treatment for conditions other than malignant neoplasm: Secondary | ICD-10-CM

## 2019-04-08 MED ORDER — LORATADINE 10 MG PO TABS: 10.0000 mg | ORAL_TABLET | Freq: Every day | ORAL | 11 refills | Status: AC

## 2019-04-08 MED ORDER — FLUCONAZOLE 150 MG PO TABS: 150.0000 mg | ORAL_TABLET | Freq: Once | ORAL | 0 refills | Status: AC

## 2019-04-12 ENCOUNTER — Other Ambulatory Visit: Payer: Self-pay

## 2019-04-12 DIAGNOSIS — N644 Mastodynia: Secondary | ICD-10-CM

## 2019-04-20 ENCOUNTER — Ambulatory Visit: Payer: Medicaid Other

## 2019-04-28 ENCOUNTER — Other Ambulatory Visit: Payer: Medicaid Other

## 2019-08-15 ENCOUNTER — Other Ambulatory Visit: Payer: Self-pay

## 2019-08-15 ENCOUNTER — Emergency Department (HOSPITAL_BASED_OUTPATIENT_CLINIC_OR_DEPARTMENT_OTHER)
Admission: EM | Admit: 2019-08-15 | Discharge: 2019-08-15 | Disposition: A | Discharge status: Home / Self Care | Payer: Medicaid Other | Attending: Emergency Medicine | Admitting: Emergency Medicine

## 2019-08-15 ENCOUNTER — Encounter (HOSPITAL_BASED_OUTPATIENT_CLINIC_OR_DEPARTMENT_OTHER): Payer: Self-pay

## 2019-08-15 DIAGNOSIS — L0291 Cutaneous abscess, unspecified: Secondary | ICD-10-CM

## 2019-08-15 DIAGNOSIS — F17210 Nicotine dependence, cigarettes, uncomplicated: Secondary | ICD-10-CM | POA: Insufficient documentation

## 2019-08-15 DIAGNOSIS — N611 Abscess of the breast and nipple: Secondary | ICD-10-CM | POA: Insufficient documentation

## 2019-08-15 MED ORDER — DOXYCYCLINE HYCLATE 100 MG PO CAPS: 100.0000 mg | ORAL_CAPSULE | Freq: Two times a day (BID) | ORAL | 0 refills | Status: AC

## 2019-08-15 NOTE — ED Notes (Signed)
ED Provider at bedside. 

## 2019-08-15 NOTE — ED Provider Notes (Signed)
[facility] EMERGENCY DEPARTMENT Provider Note   CSN: [id number] Arrival date & time: 08/15/19  1115     History Chief Complaint  Patient presents with  . Abscess    Jacqueline Hill is a 34 y.o. female.  HPI   Patient presents to the emergency department with chief complaint of abscess on her right breast.  Patient explains she has had an abscess there before and said recently she noticed this obe about 3 days ago.  She explains it has gotten larger, became more painful, but denies spontaneous drainage despite trying to pop it and placing warm water.  She denies fever, chills, chest pain, shortness of breath.  She denies any alleviating or aggravating factors.  She admits that last time this happened she was placed on antibiotics and it went away on its own.  Patient has no significant medical history, does not take any medication on daily basis.  Patient denies headache, fever, chills, sore throat, cough, chest pain, shortness of breath, abdominal pain, nausea, vomiting, diarrhea  Past Medical History:  Diagnosis Date  . Abscess     There are no problems to display for this patient.   History reviewed. No pertinent surgical history.   OB History   No obstetric history on file.     No family history on file.  Social History   Tobacco Use  . Smoking status: Current Every Day Smoker  . Smokeless tobacco: Never Used  Substance Use Topics  . Alcohol use: Yes  . Drug use: Never    Types: Marijuana    Home Medications Prior to Admission medications   Medication Sig Start Date End Date Taking? Authorizing Provider  chlorhexidine (PERIDEX) 0.12 % solution Use as directed 15 mLs in the mouth or throat 2 (two) times daily. Do NOT swallow 03/21/18   Harlene Salts A, PA-C  loratadine (CLARITIN) 10 MG tablet Take 1 tablet (10 mg total) by mouth daily. 04/08/19   Anders Simmonds, PA-C  methocarbamol (ROBAXIN) 500 MG tablet Take 1 tablet (500 mg total) by mouth 2  (two) times daily. 09/14/18   Maxwell Caul, PA-C  ondansetron (ZOFRAN) 4 MG tablet Take 1 tablet (4 mg total) by mouth every 6 (six) hours. 03/09/19   Arlyn Dunning, PA-C    Allergies    Patient has no known allergies.  Review of Systems   Review of Systems  Constitutional: Negative for chills and fever.  HENT: Negative for congestion and sore throat.   Eyes: Negative for visual disturbance.  Respiratory: Negative for choking and shortness of breath.   Cardiovascular: Negative for chest pain and palpitations.  Gastrointestinal: Negative for abdominal pain, constipation, diarrhea, nausea and vomiting.  Genitourinary: Negative for enuresis.  Musculoskeletal: Negative for back pain.  Skin: Negative for rash.       Admits to abscess on right breast.  Neurological: Negative for dizziness and headaches.  Hematological: Does not bruise/bleed easily.    Physical Exam Updated Vital Signs BP 110/81 (BP Location: Left Arm)   Pulse 90   Temp 98.8 F (37.1 C) (Oral)   Resp 18   Ht 5\' 1"  (1.549 m)   Wt 58.5 kg   LMP 08/01/2019   SpO2 100%   BMI 24.36 kg/m   Physical Exam Vitals and nursing note reviewed. Exam conducted with a chaperone present.  Constitutional:      General: She is not in acute distress.    Appearance: Normal appearance. She is not ill-appearing or diaphoretic.  HENT:     Head: Normocephalic and atraumatic.     Nose: No congestion or rhinorrhea.  Eyes:     General: No scleral icterus.       Right eye: No discharge.        Left eye: No discharge.     Conjunctiva/sclera: Conjunctivae normal.  Pulmonary:     Effort: Pulmonary effort is normal. No respiratory distress.     Breath sounds: Normal breath sounds. No wheezing.  Musculoskeletal:     Cervical back: Neck supple.     Right lower leg: No edema.     Left lower leg: No edema.  Skin:    General: Skin is warm and dry.     Coloration: Skin is not jaundiced or pale.     Comments: Patient's right breast  was visualized, there is a abscess noted at the 12 o'clock position above the areola.  The abscess was not warm to the touch, not erythematous, no edema noted, tender to palpation, induration fluctuance felt.  Neurological:     Mental Status: She is alert and oriented to person, place, and time.  Psychiatric:        Mood and Affect: Mood normal.     ED Results / Procedures / Treatments   Labs (all labs ordered are listed, but only abnormal results are displayed) Labs Reviewed - No data to display  EKG None  Radiology No results found.  Procedures Procedures (including critical care time)  Medications Ordered in ED Medications - No data to display  ED Course  I have reviewed the triage vital signs and the nursing notes.  Pertinent labs & imaging results that were available during my care of the patient were reviewed by me and considered in my medical decision making (see chart for details).    MDM Rules/Calculators/A&P                          I have personally reviewed all imaging, labs and have interpreted them.  Unlikely patient suffering from a systemic infection as she was nontoxic-appearing, vital signs reassuring, she denies fever, chills, nausea or vomiting.  Unlikely patient suffering from cellulitis as the abscess was visualized, there is no overlying erythema, edema, no drainage or pustulant discharge noted.  Abscess could not be I and D  due to location, risk of cosmetic scarring as well as proximity to areola.  Risks do not outweigh the benefits.  Will place patient on antibiotics and refer patient to a general surgeon for further evaluation management.  Due to patient's nontoxic appearing, stable vital signs, benign physical exam further lab work and imaging were not indicated.  Patient appears resting calmly bed showing no acute signs stress.  Patient vital signs have remained stable does not meet criteria to be admitted to the hospital.  Likely patient suffering  from an abscess and will place her on antibiotics cover her for MRSA.  Recommend she follows up with a general surgeon for further evaluation management.  Patient discussed with attending who agrees with assessment and plan.  Patient was given at home care as well as strict return precautions.  Patient verbalized that she understood and agreed with the plan. Final Clinical Impression(s) / ED Diagnoses Final diagnoses:  None    Rx / DC Orders ED Discharge Orders    None       Carroll Sage, PA-C 08/15/19 1419    Benjiman Core, MD 08/15/19 1515

## 2019-08-15 NOTE — Discharge Instructions (Signed)
You have been seen here for an abscess, physical exam and vital signs look reassuring.  I prescribed you antibiotics please take as prescribed.  This antibiotic can make you more susceptible to sunburn please wear sunscreen while outside.  You may also place warm compresses on the abscess as this can help the form a head and drain spontaneously.  I given you the information for a general surgeon I want you to call them at your earliest convenience as they can further manage your abscesses.  I have also given you information for community health and wellness they work with individuals with little to no insurance and can help you find a primary care provider.  I want to come back to the emergency department if you develop fever, chills, shortness of breath, chest pain, uncontrolled nausea, vomiting, diarrhea as the symptoms require further evaluation management.

## 2019-08-15 NOTE — ED Notes (Signed)
Pt discharged to home. Discharge instructions have been discussed with patient and/or family members. Pt verbally acknowledges understanding d/c instructions, and endorses comprehension to checkout at registration before leaving.  °

## 2019-08-15 NOTE — ED Triage Notes (Signed)
Pt arrives with abscess to right breast near nipple. Pt reports recurrent abscesses in this area.

## 2019-09-14 ENCOUNTER — Other Ambulatory Visit: Payer: Self-pay | Admitting: General Surgery

## 2019-09-14 ENCOUNTER — Other Ambulatory Visit: Payer: Self-pay | Admitting: Student

## 2019-09-14 DIAGNOSIS — N611 Abscess of the breast and nipple: Secondary | ICD-10-CM

## 2019-09-21 ENCOUNTER — Other Ambulatory Visit: Payer: Medicaid Other

## 2019-10-06 ENCOUNTER — Other Ambulatory Visit: Payer: Self-pay | Admitting: General Surgery

## 2019-10-06 DIAGNOSIS — N611 Abscess of the breast and nipple: Secondary | ICD-10-CM

## 2019-10-08 ENCOUNTER — Other Ambulatory Visit: Payer: Self-pay

## 2019-10-08 ENCOUNTER — Ambulatory Visit
Admission: RE | Admit: 2019-10-08 | Discharge: 2019-10-08 | Disposition: A | Discharge status: Home / Self Care | Payer: Medicaid Other | Source: Ambulatory Visit | Attending: General Surgery | Admitting: General Surgery

## 2019-10-08 DIAGNOSIS — N611 Abscess of the breast and nipple: Secondary | ICD-10-CM

## 2019-10-12 ENCOUNTER — Other Ambulatory Visit: Payer: Self-pay | Admitting: General Surgery

## 2019-11-09 ENCOUNTER — Encounter (HOSPITAL_BASED_OUTPATIENT_CLINIC_OR_DEPARTMENT_OTHER): Payer: Self-pay | Admitting: General Surgery

## 2019-11-09 ENCOUNTER — Other Ambulatory Visit: Payer: Self-pay

## 2019-11-12 ENCOUNTER — Other Ambulatory Visit (HOSPITAL_COMMUNITY)
Admission: RE | Admit: 2019-11-12 | Discharge: 2019-11-12 | Disposition: A | Discharge status: Home / Self Care | Payer: 59 | Source: Ambulatory Visit | Attending: General Surgery | Admitting: General Surgery

## 2019-11-12 DIAGNOSIS — Z20822 Contact with and (suspected) exposure to covid-19: Secondary | ICD-10-CM | POA: Insufficient documentation

## 2019-11-12 DIAGNOSIS — Z01812 Encounter for preprocedural laboratory examination: Secondary | ICD-10-CM | POA: Diagnosis present

## 2019-11-12 LAB — SARS CORONAVIRUS 2 (TAT 6-24 HRS): SARS Coronavirus 2: NEGATIVE

## 2019-11-12 NOTE — Progress Notes (Signed)

## 2019-11-16 ENCOUNTER — Ambulatory Visit (HOSPITAL_BASED_OUTPATIENT_CLINIC_OR_DEPARTMENT_OTHER): Payer: 59 | Admitting: Certified Registered Nurse Anesthetist

## 2019-11-16 ENCOUNTER — Encounter (HOSPITAL_BASED_OUTPATIENT_CLINIC_OR_DEPARTMENT_OTHER)
Admission: RE | Disposition: A | Discharge status: Home / Self Care | Payer: Self-pay | Source: Home / Self Care | Attending: General Surgery

## 2019-11-16 ENCOUNTER — Other Ambulatory Visit: Payer: Self-pay

## 2019-11-16 ENCOUNTER — Encounter (HOSPITAL_BASED_OUTPATIENT_CLINIC_OR_DEPARTMENT_OTHER): Payer: Self-pay | Admitting: General Surgery

## 2019-11-16 ENCOUNTER — Ambulatory Visit (HOSPITAL_BASED_OUTPATIENT_CLINIC_OR_DEPARTMENT_OTHER)
Admission: RE | Admit: 2019-11-16 | Discharge: 2019-11-16 | Disposition: A | Discharge status: Home / Self Care | Payer: 59 | Attending: General Surgery | Admitting: General Surgery

## 2019-11-16 DIAGNOSIS — N6312 Unspecified lump in the right breast, upper inner quadrant: Secondary | ICD-10-CM | POA: Insufficient documentation

## 2019-11-16 DIAGNOSIS — F17210 Nicotine dependence, cigarettes, uncomplicated: Secondary | ICD-10-CM | POA: Diagnosis not present

## 2019-11-16 DIAGNOSIS — N611 Abscess of the breast and nipple: Secondary | ICD-10-CM | POA: Diagnosis not present

## 2019-11-16 HISTORY — DX: Unspecified asthma, uncomplicated: J45.909

## 2019-11-16 HISTORY — PX: MASS EXCISION: SHX2000

## 2019-11-16 HISTORY — DX: Other seasonal allergic rhinitis: J30.2

## 2019-11-16 LAB — POCT PREGNANCY, URINE: Preg Test, Ur: NEGATIVE

## 2019-11-16 SURGERY — EXCISION MASS
Anesthesia: General | Site: Breast | Laterality: Right

## 2019-11-16 MED ORDER — KETOROLAC TROMETHAMINE 15 MG/ML IJ SOLN
INTRAMUSCULAR | Status: AC
  Filled 2019-11-16: qty 1

## 2019-11-16 MED ORDER — FENTANYL CITRATE (PF) 100 MCG/2ML IJ SOLN
INTRAMUSCULAR | Status: DC | PRN
  Administered 2019-11-16 (×2): 50 ug via INTRAVENOUS

## 2019-11-16 MED ORDER — HYDROMORPHONE HCL 1 MG/ML IJ SOLN: 0.2500 mg | INTRAMUSCULAR | Status: DC | PRN

## 2019-11-16 MED ORDER — GABAPENTIN 100 MG PO CAPS
100.0000 mg | ORAL_CAPSULE | ORAL | Status: AC
  Administered 2019-11-16: 100 mg via ORAL

## 2019-11-16 MED ORDER — GABAPENTIN 100 MG PO CAPS
ORAL_CAPSULE | ORAL | Status: AC
  Filled 2019-11-16: qty 1

## 2019-11-16 MED ORDER — GLYCOPYRROLATE PF 0.2 MG/ML IJ SOSY
PREFILLED_SYRINGE | INTRAMUSCULAR | Status: AC
  Filled 2019-11-16: qty 1

## 2019-11-16 MED ORDER — DEXAMETHASONE SODIUM PHOSPHATE 4 MG/ML IJ SOLN
INTRAMUSCULAR | Status: DC | PRN
  Administered 2019-11-16: 5 mg via INTRAVENOUS

## 2019-11-16 MED ORDER — OXYCODONE HCL 5 MG PO TABS: 5.0000 mg | ORAL_TABLET | Freq: Once | ORAL | Status: DC | PRN

## 2019-11-16 MED ORDER — OXYCODONE HCL 5 MG PO TABS
5.0000 mg | ORAL_TABLET | Freq: Four times a day (QID) | ORAL | 0 refills | Status: DC | PRN
Start: 2019-11-16 — End: 2022-04-22

## 2019-11-16 MED ORDER — ACETAMINOPHEN 500 MG PO TABS
ORAL_TABLET | ORAL | Status: AC
  Filled 2019-11-16: qty 2

## 2019-11-16 MED ORDER — ACETAMINOPHEN 500 MG PO TABS
1000.0000 mg | ORAL_TABLET | ORAL | Status: AC
  Administered 2019-11-16: 1000 mg via ORAL

## 2019-11-16 MED ORDER — LACTATED RINGERS IV SOLN: INTRAVENOUS | Status: DC

## 2019-11-16 MED ORDER — OXYCODONE HCL 5 MG/5ML PO SOLN: 5.0000 mg | Freq: Once | ORAL | Status: DC | PRN

## 2019-11-16 MED ORDER — ONDANSETRON HCL 4 MG/2ML IJ SOLN
INTRAMUSCULAR | Status: AC
  Filled 2019-11-16: qty 2

## 2019-11-16 MED ORDER — LIDOCAINE 2% (20 MG/ML) 5 ML SYRINGE
INTRAMUSCULAR | Status: DC | PRN
  Administered 2019-11-16: 60 mg via INTRAVENOUS

## 2019-11-16 MED ORDER — ENSURE PRE-SURGERY PO LIQD: 296.0000 mL | Freq: Once | ORAL | Status: DC

## 2019-11-16 MED ORDER — BUPIVACAINE HCL (PF) 0.25 % IJ SOLN
INTRAMUSCULAR | Status: DC | PRN
  Administered 2019-11-16: 10 mL

## 2019-11-16 MED ORDER — FENTANYL CITRATE (PF) 100 MCG/2ML IJ SOLN
INTRAMUSCULAR | Status: AC
  Filled 2019-11-16: qty 2

## 2019-11-16 MED ORDER — MIDAZOLAM HCL 2 MG/2ML IJ SOLN
INTRAMUSCULAR | Status: AC
  Filled 2019-11-16: qty 2

## 2019-11-16 MED ORDER — MIDAZOLAM HCL 5 MG/5ML IJ SOLN
INTRAMUSCULAR | Status: DC | PRN
  Administered 2019-11-16: 2 mg via INTRAVENOUS

## 2019-11-16 MED ORDER — LIDOCAINE 2% (20 MG/ML) 5 ML SYRINGE
INTRAMUSCULAR | Status: AC
  Filled 2019-11-16: qty 5

## 2019-11-16 MED ORDER — ONDANSETRON HCL 4 MG/2ML IJ SOLN
INTRAMUSCULAR | Status: DC | PRN
  Administered 2019-11-16: 4 mg via INTRAVENOUS

## 2019-11-16 MED ORDER — PROPOFOL 10 MG/ML IV BOLUS
INTRAVENOUS | Status: AC
  Filled 2019-11-16: qty 20

## 2019-11-16 MED ORDER — KETOROLAC TROMETHAMINE 15 MG/ML IJ SOLN
15.0000 mg | INTRAMUSCULAR | Status: AC
  Administered 2019-11-16: 15 mg via INTRAVENOUS

## 2019-11-16 MED ORDER — MEPERIDINE HCL 25 MG/ML IJ SOLN: 6.2500 mg | INTRAMUSCULAR | Status: DC | PRN

## 2019-11-16 MED ORDER — DEXAMETHASONE SODIUM PHOSPHATE 10 MG/ML IJ SOLN
INTRAMUSCULAR | Status: AC
  Filled 2019-11-16: qty 1

## 2019-11-16 MED ORDER — CLINDAMYCIN PHOSPHATE 900 MG/50ML IV SOLN
900.0000 mg | INTRAVENOUS | Status: AC
  Administered 2019-11-16: 900 mg via INTRAVENOUS

## 2019-11-16 MED ORDER — PROMETHAZINE HCL 25 MG/ML IJ SOLN: 6.2500 mg | INTRAMUSCULAR | Status: DC | PRN

## 2019-11-16 MED ORDER — AMISULPRIDE (ANTIEMETIC) 5 MG/2ML IV SOLN: 10.0000 mg | Freq: Once | INTRAVENOUS | Status: DC | PRN

## 2019-11-16 MED ORDER — CLINDAMYCIN PHOSPHATE 900 MG/50ML IV SOLN
INTRAVENOUS | Status: AC
  Filled 2019-11-16: qty 50

## 2019-11-16 MED ORDER — GLYCOPYRROLATE PF 0.2 MG/ML IJ SOSY
PREFILLED_SYRINGE | INTRAMUSCULAR | Status: DC | PRN
  Administered 2019-11-16: .2 mg via INTRAVENOUS

## 2019-11-16 MED ORDER — ARTIFICIAL TEARS OPHTHALMIC OINT
TOPICAL_OINTMENT | OPHTHALMIC | Status: AC
  Filled 2019-11-16: qty 3.5

## 2019-11-16 MED ORDER — PROPOFOL 10 MG/ML IV BOLUS
INTRAVENOUS | Status: DC | PRN
  Administered 2019-11-16: 200 mg via INTRAVENOUS

## 2019-11-16 MED ORDER — PHENYLEPHRINE 40 MCG/ML (10ML) SYRINGE FOR IV PUSH (FOR BLOOD PRESSURE SUPPORT)
PREFILLED_SYRINGE | INTRAVENOUS | Status: AC
  Filled 2019-11-16: qty 10

## 2019-11-16 SURGICAL SUPPLY — 45 items
ADH SKN CLS APL DERMABOND .7 (GAUZE/BANDAGES/DRESSINGS) ×1
APL PRP STRL LF DISP 70% ISPRP (MISCELLANEOUS) ×1
BINDER BREAST LRG (GAUZE/BANDAGES/DRESSINGS) ×2 IMPLANT
BLADE SURG 15 STRL LF DISP TIS (BLADE) ×1 IMPLANT
BLADE SURG 15 STRL SS (BLADE) ×2
CANISTER SUCT 1200ML W/VALVE (MISCELLANEOUS) ×2 IMPLANT
CHLORAPREP W/TINT 26 (MISCELLANEOUS) ×2 IMPLANT
CLSR STERI-STRIP ANTIMIC 1/2X4 (GAUZE/BANDAGES/DRESSINGS) ×2 IMPLANT
COVER BACK TABLE 60X90IN (DRAPES) ×2 IMPLANT
COVER MAYO STAND STRL (DRAPES) ×2 IMPLANT
DERMABOND ADVANCED (GAUZE/BANDAGES/DRESSINGS) ×1
DERMABOND ADVANCED .7 DNX12 (GAUZE/BANDAGES/DRESSINGS) ×1 IMPLANT
DRAPE LAPAROTOMY 100X72 PEDS (DRAPES) ×2 IMPLANT
DRAPE UTILITY XL STRL (DRAPES) ×2 IMPLANT
ELECT COATED BLADE 2.86 ST (ELECTRODE) ×2 IMPLANT
ELECT REM PT RETURN 9FT ADLT (ELECTROSURGICAL) ×2
ELECTRODE REM PT RTRN 9FT ADLT (ELECTROSURGICAL) ×1 IMPLANT
GLOVE BIO SURGEON STRL SZ 6.5 (GLOVE) ×2 IMPLANT
GLOVE BIO SURGEON STRL SZ7 (GLOVE) ×2 IMPLANT
GLOVE BIOGEL PI IND STRL 7.5 (GLOVE) ×1 IMPLANT
GLOVE BIOGEL PI INDICATOR 7.5 (GLOVE) ×1
GOWN STRL REUS W/ TWL LRG LVL3 (GOWN DISPOSABLE) ×2 IMPLANT
GOWN STRL REUS W/TWL LRG LVL3 (GOWN DISPOSABLE) ×4
KIT MARKER MARGIN INK (KITS) ×2 IMPLANT
NEEDLE HYPO 25X1 1.5 SAFETY (NEEDLE) ×2 IMPLANT
NS IRRIG 1000ML POUR BTL (IV SOLUTION) ×2 IMPLANT
PACK BASIN DAY SURGERY FS (CUSTOM PROCEDURE TRAY) ×2 IMPLANT
PENCIL SMOKE EVACUATOR (MISCELLANEOUS) ×2 IMPLANT
SLEEVE SCD COMPRESS KNEE MED (MISCELLANEOUS) ×2 IMPLANT
SPONGE LAP 4X18 RFD (DISPOSABLE) ×2 IMPLANT
SUT ETHILON 2 0 FS 18 (SUTURE) IMPLANT
SUT MNCRL AB 4-0 PS2 18 (SUTURE) IMPLANT
SUT MON AB 5-0 PS2 18 (SUTURE) ×2 IMPLANT
SUT SILK 2 0 SH (SUTURE) IMPLANT
SUT VIC AB 2-0 SH 27 (SUTURE) ×2
SUT VIC AB 2-0 SH 27XBRD (SUTURE) ×1 IMPLANT
SUT VIC AB 3-0 SH 27 (SUTURE) ×2
SUT VIC AB 3-0 SH 27X BRD (SUTURE) ×1 IMPLANT
SUT VICRYL 3-0 CR8 SH (SUTURE) IMPLANT
SUT VICRYL 4-0 PS2 18IN ABS (SUTURE) IMPLANT
SYR CONTROL 10ML LL (SYRINGE) ×2 IMPLANT
TOWEL GREEN STERILE FF (TOWEL DISPOSABLE) ×2 IMPLANT
TUBE CONNECTING 20X1/4 (TUBING) ×2 IMPLANT
UNDERPAD 30X36 HEAVY ABSORB (UNDERPADS AND DIAPERS) ×2 IMPLANT
YANKAUER SUCT BULB TIP NO VENT (SUCTIONS) ×2 IMPLANT

## 2019-11-16 NOTE — Op Note (Signed)
Preoperative diagnosis: Chronic right breast mass c/w abscess Postoperative diagnosis: Same as above Procedure: Right breast mass excisional biopsy Surgeon: Dr. Harden Mo Anesthesia: General Estimated blood loss: Minimal Complications: None Drains: None Specimens: Right breast mass to pathology Sponge and count was correct at completion Disposition to recovery stable condition  Indications:34 year old female who presents with a breast abscess. she was seen in our urgent office with complaints of a recurrent right breast abscess which has been present for about 2 years. She states she has been seen in the ER for this multiple times, but they have never drained it. she has never had any imaging eiterh Most recently, she was seen on 08/15/19. She was given antibiotics and referral to [facility] for possible I&D given that an obvious abscess was present but they did not feel comfortable lancing it. However, the next day the abscess drained, relieving most of her symptoms. She called our office a few days ago with another abscess. At that time, there was no active draining so we tried to get her to be seen by the breast center, but they were unable to see her until 09/21/19. In the past 1-2 days, the area drained on its own - there is still some sharp pain, but no major pain. She denies fevers. She denies nipple piercing. She does have a tattoo on her left and right chest wall. She smokes about 2 cigarettes a day. She is frustrated because this is interfering with her daily life, it is uncomfortable to wear a bra and difficult to work. She believes it has flared up about 10-20 times in the past 2 years. she comes in to see me today to discuss definitive therapy. no fh breast or ovarian cancer. she has no nipple discharge. She underwent an [place] and showed this mass and we dsicussed excision   Procedure: After informed consent was obtained the patient was taken to the operating room.  She had SCDs  in place.  She was given antibiotics.  She was then placed under general anesthesia without complication.  She was prepped and draped in the standard sterile surgical fashion.  A surgical timeout was then performed.  I made an elliptical incision around the fistula. I then removed the entire mass and what appeared to be chronic abscess cavity. This was marked with paint.  I removed another small area retroareolar that was hard as well. .  I then obtained hemostasis.  I closed the breast tissue with 2-0 Vicryl.  The skin was closed with 3-0 Vicryl and 5-0 Monocryl.  Glue and Steri-Strips were applied.  A binder was placed.  She tolerated this well was extubated and transferred to the recovery room in stable condition.

## 2019-11-16 NOTE — Anesthesia Preprocedure Evaluation (Signed)
Anesthesia Evaluation  Patient identified by MRN, date of birth, ID band Patient awake    Reviewed: Allergy & Precautions, NPO status , Patient's Chart, lab work & pertinent test results  Airway Mallampati: II  TM Distance: >3 FB Neck ROM: Full    Dental no notable dental hx.    Pulmonary asthma , former smoker,    Pulmonary exam normal breath sounds clear to auscultation       Cardiovascular negative cardio ROS Normal cardiovascular exam Rhythm:Regular Rate:Normal     Neuro/Psych negative neurological ROS  negative psych ROS   GI/Hepatic negative GI ROS, Neg liver ROS,   Endo/Other  negative endocrine ROS  Renal/GU negative Renal ROS  negative genitourinary   Musculoskeletal negative musculoskeletal ROS (+)   Abdominal   Peds negative pediatric ROS (+)  Hematology negative hematology ROS (+)   Anesthesia Other Findings   Reproductive/Obstetrics negative OB ROS                             Anesthesia Physical Anesthesia Plan  ASA: II  Anesthesia Plan: General   Post-op Pain Management:    Induction: Intravenous  PONV Risk Score and Plan: 3 and Ondansetron, Dexamethasone, Midazolam and Treatment may vary due to age or medical condition  Airway Management Planned: LMA  Additional Equipment:   Intra-op Plan:   Post-operative Plan: Extubation in OR  Informed Consent: I have reviewed the patients History and Physical, chart, labs and discussed the procedure including the risks, benefits and alternatives for the proposed anesthesia with the patient or authorized representative who has indicated his/her understanding and acceptance.     Dental advisory given  Plan Discussed with: CRNA  Anesthesia Plan Comments:         Anesthesia Quick Evaluation

## 2019-11-16 NOTE — Discharge Instructions (Signed)
[facility] Office Phone Number [phone]  POST OP INSTRUCTIONS Take 400 mg of ibuprofen every 8 hours or 650 mg tylenol every 6 hours for next 72 hours then as needed. Use ice several times daily also. Always review your discharge instruction sheet given to you by the facility where your surgery was performed.  IF YOU HAVE DISABILITY OR FAMILY LEAVE FORMS, YOU MUST BRING THEM TO THE OFFICE FOR PROCESSING.  DO NOT GIVE THEM TO YOUR DOCTOR.  1. A prescription for pain medication may be given to you upon discharge.  Take your pain medication as prescribed, if needed.  If narcotic pain medicine is not needed, then you may take acetaminophen (Tylenol), naprosyn (Alleve) or ibuprofen (Advil) as needed. 2. Take your usually prescribed medications unless otherwise directed 3. If you need a refill on your pain medication, please contact your pharmacy.  They will contact our office to request authorization.  Prescriptions will not be filled after 5pm or on week-ends. 4. You should eat very light the first 24 hours after surgery, such as soup, crackers, pudding, etc.  Resume your normal diet the day after surgery. 5. Most patients will experience some swelling and bruising in the breast.  Ice packs and a good support bra will help.  Wear the breast binder provided or a sports bra for 72 hours day and night.  After that wear a sports bra during the day until you return to the office. Swelling and bruising can take several days to resolve.  6. It is common to experience some constipation if taking pain medication after surgery.  Increasing fluid intake and taking a stool softener will usually help or prevent this problem from occurring.  A mild laxative (Milk of Magnesia or Miralax) should be taken according to package directions if there are no bowel movements after 48 hours. 7. Unless discharge instructions indicate otherwise, you may remove your bandages 48 hours after surgery and you may  shower at that time.  You may have steri-strips (small skin tapes) in place directly over the incision.  These strips should be left on the skin for 7-10 days and will come off on their own.  If your surgeon used skin glue on the incision, you may shower in 24 hours.  The glue will flake off over the next 2-3 weeks.  Any sutures or staples will be removed at the office during your follow-up visit. 8. ACTIVITIES:  You may resume regular daily activities (gradually increasing) beginning the next day.  Wearing a good support bra or sports bra minimizes pain and swelling.  You may have sexual intercourse when it is comfortable. a. You may drive when you no longer are taking prescription pain medication, you can comfortably wear a seatbelt, and you can safely maneuver your car and apply brakes. b. RETURN TO WORK:  ______________________________________________________________________________________ 9. You should see your doctor in the office for a follow-up appointment approximately two weeks after your surgery.  Your doctor's nurse will typically make your follow-up appointment when she calls you with your pathology report.  Expect your pathology report 3-4 business days after your surgery.  You may call to check if you do not hear from [place] after three days. 10. OTHER INSTRUCTIONS: _______________________________________________________________________________________________ _____________________________________________________________________________________________________________________________________ _____________________________________________________________________________________________________________________________________ _____________________________________________________________________________________________________________________________________  WHEN TO CALL DR WAKEFIELD: 1. Fever over 101.0 2. Nausea and/or vomiting. 3. Extreme swelling or bruising. 4. Continued bleeding from  incision. 5. NO tylenol until 6 p.m. 6. Increased pain, redness, or drainage from the incision.  The clinic staff is available to answer your questions during regular business hours.  Please don't hesitate to call and ask to speak to one of the nurses for clinical concerns.  If you have a medical emergency, go to the nearest emergency room or call 911.  A surgeon from [facility] is always on call at the hospital.  For further questions, please visit [email or web address]  Post Anesthesia Home Care Instructions  Activity: Get plenty of rest for the remainder of the day. A responsible individual must stay with you for 24 hours following the procedure.  For the next 24 hours, DO NOT: -Drive a car -Advertising copywriter -Drink alcoholic beverages -Take any medication unless instructed by your physician -Make any legal decisions or sign important papers.  Meals: Start with liquid foods such as gelatin or soup. Progress to regular foods as tolerated. Avoid greasy, spicy, heavy foods. If nausea and/or vomiting occur, drink only clear liquids until the nausea and/or vomiting subsides. Call your physician if vomiting continues.  Special Instructions/Symptoms: Your throat may feel dry or sore from the anesthesia or the breathing tube placed in your throat during surgery. If this causes discomfort, gargle with warm salt water. The discomfort should disappear within 24 hours.  If you had a scopolamine patch placed behind your ear for the management of post- operative nausea and/or vomiting:  1. The medication in the patch is effective for 72 hours, after which it should be removed.  Wrap patch in a tissue and discard in the trash. Wash hands thoroughly with soap and water. 2. You may remove the patch earlier than 72 hours if you experience unpleasant side effects which may include dry mouth, dizziness or visual disturbances. 3. Avoid touching the patch. Wash your hands with soap  and water after contact with the patch.

## 2019-11-16 NOTE — Anesthesia Postprocedure Evaluation (Signed)
Anesthesia Post Note  Patient: Jacqueline Hill  Procedure(s) Performed: RIGHT BREAST CHRONIC ABSCESS/MASS EXCISION (Right Breast)     Patient location during evaluation: PACU Anesthesia Type: General Level of consciousness: awake and alert Pain management: pain level controlled Vital Signs Assessment: post-procedure vital signs reviewed and stable Respiratory status: spontaneous breathing, nonlabored ventilation and respiratory function stable Cardiovascular status: blood pressure returned to baseline and stable Postop Assessment: no apparent nausea or vomiting Anesthetic complications: no   No complications documented.  Last Vitals:  Vitals:   11/16/19 1445 11/16/19 1530  BP: (!) 124/92 131/90  Pulse: 89 82  Resp: 13 16  Temp:  (!) 36.4 C  SpO2: 100% 100%    Last Pain:  Vitals:   11/16/19 1530  TempSrc:   PainSc: Asleep                 Lowella Curb

## 2019-11-16 NOTE — Transfer of Care (Signed)
Immediate Anesthesia Transfer of Care Note  Patient: Jacqueline Hill  Procedure(s) Performed: RIGHT BREAST CHRONIC ABSCESS/MASS EXCISION (Right Breast)  Patient Location: PACU  Anesthesia Type:General  Level of Consciousness: awake, alert  and oriented  Airway & Oxygen Therapy: Patient Spontanous Breathing and Patient connected to face mask oxygen  Post-op Assessment: Report given to RN and Post -op Vital signs reviewed and stable  Post vital signs: Reviewed and stable  Last Vitals:  Vitals Value Taken Time  BP 113/78 11/16/19 1438  Temp    Pulse 96 11/16/19 1441  Resp 14 11/16/19 1441  SpO2 100 % 11/16/19 1441  Vitals shown include unvalidated device data.  Last Pain:  Vitals:   11/16/19 1325  TempSrc: Oral  PainSc: 0-No pain         Complications: No complications documented.

## 2019-11-16 NOTE — H&P (Signed)
Jacqueline Hill is an 34 y.o. female.   Chief Complaint: chronic breast abscess HPI: .34 year old female who presents with a breast abscess. she was seen in our urgent office with complaints of a recurrent right breast abscess which has been present for about 2 years. She states she has been seen in the ER for this multiple times, but they have never drained it. she has never had any imaging eiterh Most recently, she was seen on 08/15/19. She was given antibiotics and referral to [facility] for possible I&D given that an obvious abscess was present but they did not feel comfortable lancing it. However, the next day the abscess drained, relieving most of her symptoms. She called our office a few days ago with another abscess. At that time, there was no active draining so we tried to get her to be seen by the breast center, but they were unable to see her until 09/21/19. In the past 1-2 days, the area drained on its own - there is still some sharp pain, but no major pain. She denies fevers. She denies nipple piercing. She does have a tattoo on her left and right chest wall. She smokes about 2 cigarettes a day. She is frustrated because this is interfering with her daily life, it is uncomfortable to wear a bra and difficult to work. She believes it has flared up about 10-20 times in the past 2 years. she comes in to see me today to discuss definitive therapy. no fh breast or ovarian cancer. she has no nipple discharge   Past Medical History:  Diagnosis Date  . Abscess   . Asthma   . Seasonal allergies     Past Surgical History:  Procedure Laterality Date  . TUBAL LIGATION      History reviewed. No pertinent family history. Social History:  reports that she has quit smoking. She has never used smokeless tobacco. She reports current alcohol use. She reports previous drug use. Drug: Marijuana.  Allergies: No Known Allergies  No medications prior to admission.    No results found for this or any  previous visit (from the past 48 hour(s)). No results found.  Review of Systems  All other systems reviewed and are negative.   Height 5\' 1"  (1.549 m), weight 56.7 kg, last menstrual period 11/02/2019. Physical Exam  Physical Exam 01/02/2020 MD; 10/02/2019 2:05 PM) General Mental Status-Alert. Orientation-Oriented X3.  Breast Nipples-No Discharge. Note: no left breast mass right breast at 2 oclock with a 2 mm opening and vague underlying subareolar mass that is mildly tender   Lymphatic Head & Neck  General Head & Neck Lymphatics: Bilateral - Description - Normal. Axillary  General Axillary Region: Bilateral - Description - Normal. Note: no Norman adenopathy Assessment/Plan ABSCESS OF BREAST, RIGHT (N61.1) Story: 12/02/2019 of right breast, smoking cessation, right breast chronic abscess/mass excision I told her she needs to stop smoking now. this will not heal and will not get better without that. She thinks she can do this as she has done it in the past also. I will obtain an [place] of subareolar area as she has not had any imaging.  will plan to do excision of a chronic abscess cavity and the fistula above nac. we discussed risk including infection, wound healing issues, recurrence (esp if smoking).will await [place] then schedule  [place], MD 11/16/2019, 7:18 AM

## 2019-11-16 NOTE — Anesthesia Procedure Notes (Signed)
Procedure Name: LMA Insertion Date/Time: 11/16/2019 1:57 PM Performed by: Mayer Camel, CRNA Pre-anesthesia Checklist: Patient identified, Emergency Drugs available, Suction available and Patient being monitored Patient Re-evaluated:Patient Re-evaluated prior to induction Oxygen Delivery Method: Circle System Utilized Preoxygenation: Pre-oxygenation with 100% oxygen Induction Type: IV induction LMA: LMA inserted LMA Size: 4.0 Number of attempts: 1 Airway Equipment and Method: Bite block Placement Confirmation: positive ETCO2 Tube secured with: Tape Dental Injury: Teeth and Oropharynx as per pre-operative assessment

## 2019-11-17 ENCOUNTER — Encounter (HOSPITAL_BASED_OUTPATIENT_CLINIC_OR_DEPARTMENT_OTHER): Payer: Self-pay | Admitting: General Surgery

## 2019-11-17 NOTE — Addendum Note (Signed)
Addendum  created 11/17/19 1352 by Lance Coon, CRNA   Charge Capture section accepted

## 2019-11-18 LAB — SURGICAL PATHOLOGY

## 2020-01-29 ENCOUNTER — Encounter (HOSPITAL_BASED_OUTPATIENT_CLINIC_OR_DEPARTMENT_OTHER): Payer: Self-pay | Admitting: Emergency Medicine

## 2020-01-29 ENCOUNTER — Emergency Department (HOSPITAL_BASED_OUTPATIENT_CLINIC_OR_DEPARTMENT_OTHER)
Admission: EM | Admit: 2020-01-29 | Discharge: 2020-01-29 | Disposition: A | Discharge status: Home / Self Care | Payer: HRSA Program | Attending: Emergency Medicine | Admitting: Emergency Medicine

## 2020-01-29 ENCOUNTER — Other Ambulatory Visit: Payer: Self-pay

## 2020-01-29 DIAGNOSIS — R000 Tachycardia, unspecified: Secondary | ICD-10-CM | POA: Insufficient documentation

## 2020-01-29 DIAGNOSIS — Z20822 Contact with and (suspected) exposure to covid-19: Secondary | ICD-10-CM

## 2020-01-29 DIAGNOSIS — U071 COVID-19: Secondary | ICD-10-CM | POA: Insufficient documentation

## 2020-01-29 DIAGNOSIS — J45909 Unspecified asthma, uncomplicated: Secondary | ICD-10-CM | POA: Insufficient documentation

## 2020-01-29 DIAGNOSIS — J111 Influenza due to unidentified influenza virus with other respiratory manifestations: Secondary | ICD-10-CM | POA: Insufficient documentation

## 2020-01-29 DIAGNOSIS — Z87891 Personal history of nicotine dependence: Secondary | ICD-10-CM | POA: Insufficient documentation

## 2020-01-29 DIAGNOSIS — R059 Cough, unspecified: Secondary | ICD-10-CM | POA: Diagnosis present

## 2020-01-29 MED ORDER — IBUPROFEN 400 MG PO TABS
400.0000 mg | ORAL_TABLET | Freq: Once | ORAL | Status: AC | PRN
  Administered 2020-01-29: 400 mg via ORAL
  Filled 2020-01-29: qty 1

## 2020-01-29 NOTE — ED Provider Notes (Signed)
[facility] EMERGENCY DEPARTMENT Provider Note   CSN: [id number] Arrival date & time: 01/29/20  1656     History Chief Complaint  Patient presents with  . Chest Pain  . Generalized Body Aches    Jacqueline Hill is a 35 y.o. female.  Patient presents the emergency department for evaluation of body aches, headache, chills, and cough starting today. Patient states that she had minimal symptoms yesterday. No documented fevers. No nausea, vomiting, or diarrhea. No known sick contacts. No urinary symptoms. No treatments prior to arrival. She is not vaccinated against Covid.        Past Medical History:  Diagnosis Date  . Abscess   . Asthma   . Seasonal allergies     There are no problems to display for this patient.   Past Surgical History:  Procedure Laterality Date  . MASS EXCISION Right 11/16/2019   Procedure: RIGHT BREAST CHRONIC ABSCESS/MASS EXCISION;  Surgeon: Emelia Loron, MD;  Location: [facility];  Service: General;  Laterality: Right;  . TUBAL LIGATION       OB History   No obstetric history on file.     No family history on file.  Social History   Tobacco Use  . Smoking status: Former Games developer  . Smokeless tobacco: Never Used  Substance Use Topics  . Alcohol use: Yes  . Drug use: Not Currently    Types: Marijuana    Home Medications Prior to Admission medications   Medication Sig Start Date End Date Taking? Authorizing Provider  albuterol (VENTOLIN HFA) 108 (90 Base) MCG/ACT inhaler Inhale into the lungs every 6 (six) hours as needed for wheezing or shortness of breath.    [provider]  loratadine (CLARITIN) 10 MG tablet Take 1 tablet (10 mg total) by mouth daily. 04/08/19   Anders Simmonds, PA-C  Multiple Vitamin (MULTIVITAMIN ADULT PO) Take by mouth.    [provider]  oxyCODONE (OXY IR/ROXICODONE) 5 MG immediate release tablet Take 1 tablet (5 mg total) by mouth every 6 (six) hours as needed.  11/16/19   Emelia Loron, MD    Allergies    Patient has no known allergies.  Review of Systems   Review of Systems  Constitutional: Positive for chills. Negative for fever.  HENT: Negative for rhinorrhea and sore throat.   Eyes: Negative for redness.  Respiratory: Positive for cough. Negative for shortness of breath.   Cardiovascular: Negative for chest pain.  Gastrointestinal: Negative for abdominal pain, diarrhea, nausea and vomiting.  Genitourinary: Negative for dysuria, frequency, hematuria and urgency.  Musculoskeletal: Positive for myalgias.  Skin: Negative for rash.  Neurological: Positive for headaches.    Physical Exam Updated Vital Signs BP 101/71   Pulse 90   Temp 98.6 F (37 C) (Oral)   Resp 16   Ht 5\' 1"  (1.549 m)   Wt 58.1 kg   SpO2 100%   BMI 24.19 kg/m   Physical Exam Vitals and nursing note reviewed.  Constitutional:      Appearance: She is well-developed and well-nourished.  HENT:     Head: Normocephalic and atraumatic.     Jaw: No trismus.     Right Ear: Tympanic membrane, ear canal and external ear normal.     Left Ear: Tympanic membrane, ear canal and external ear normal.     Nose: Nose normal. No mucosal edema or rhinorrhea.     Mouth/Throat:     Mouth: Oropharynx is clear and moist and mucous membranes  are normal. Mucous membranes are not dry. No oral lesions.     Pharynx: Uvula midline. No oropharyngeal exudate, posterior oropharyngeal edema, posterior oropharyngeal erythema or uvula swelling.     Tonsils: No tonsillar abscesses.  Eyes:     General:        Right eye: No discharge.        Left eye: No discharge.     Conjunctiva/sclera: Conjunctivae normal.  Cardiovascular:     Rate and Rhythm: Regular rhythm. Tachycardia present.     Heart sounds: Normal heart sounds.     Comments: HR 100 Pulmonary:     Effort: Pulmonary effort is normal. No respiratory distress.     Breath sounds: Normal breath sounds. No wheezing or rales.   Abdominal:     Palpations: Abdomen is soft.     Tenderness: There is no abdominal tenderness.  Musculoskeletal:     Cervical back: Normal range of motion and neck supple.  Lymphadenopathy:     Cervical: No cervical adenopathy.  Skin:    General: Skin is warm and dry.  Neurological:     Mental Status: She is alert.  Psychiatric:        Mood and Affect: Mood and affect normal.     ED Results / Procedures / Treatments   Labs (all labs ordered are listed, but only abnormal results are displayed) Labs Reviewed  SARS CORONAVIRUS 2 (TAT 6-24 HRS)    EKG EKG Interpretation  Date/Time:  Saturday January 29 2020 17:06:42 EST Ventricular Rate:  104 PR Interval:  138 QRS Duration: 72 QT Interval:  348 QTC Calculation: 457 R Axis:   -51 Text Interpretation: Sinus tachycardia Left axis deviation Abnormal ECG Confirmed by Virgina Norfolk [phone]) on 01/29/2020 6:53:58 PM   Radiology No results found.  Procedures Procedures (including critical care time)  Medications Ordered in ED Medications  ibuprofen (ADVIL) tablet 400 mg (400 mg Oral Given 01/29/20 1718)    ED Course  I have reviewed the triage vital signs and the nursing notes.  Pertinent labs & imaging results that were available during my care of the patient were reviewed by me and considered in my medical decision making (see chart for details).  Patient seen and examined. EKG reviewed.   Vital signs reviewed and are as follows: BP 101/71   Pulse 90   Temp 98.6 F (37 C) (Oral)   Resp 16   Ht 5\' 1"  (1.549 m)   Wt 58.1 kg   SpO2 100%   BMI 24.19 kg/m    Detailed discussion had with with patient regarding COVID-19 precautions and written instructions given as well.  We discussed need to isolate themselves for 5 days from onset of symptoms and have 24 hours of improvement prior to breaking isolation.  We discussed that when breaking isolation, mask wearing for 5 additional days is required.  We discussed signs  symptoms to return which include worsening shortness of breath, trouble breathing, or increased work of breathing.  Also return with persistent vomiting, confusion, passing out, or if they have any other concerns. Counseled on the need for rest and good hydration. Discussed that high-risk contacts should be aware of positive result and they need to quarantine and be tested if they develop any symptoms. Patient verbalizes understanding.   Valisha Heslin was evaluated in Emergency Department on 01/29/2020 for the symptoms described in the history of present illness. She was evaluated in the context of the global COVID-19 pandemic, which necessitated consideration that the  patient might be at risk for infection with the SARS-CoV-2 virus that causes COVID-19. Institutional protocols and algorithms that pertain to the evaluation of patients at risk for COVID-19 are in a state of rapid change based on information released by regulatory bodies including the CDC and federal and state organizations. These policies and algorithms were followed during the patient's care in the ED.      MDM Rules/Calculators/A&P                          Patient currently with flulike illness, symptoms concerning for Covid.  She is not hypoxic.  Mildly tachycardic on arrival.  Do not suspect significant dehydration at this point.  She appears well, nontoxic.  Covid testing ordered and is pending.  Encouraged return instructions as above.  Supportive care indicated at this point.   Final Clinical Impression(s) / ED Diagnoses Final diagnoses:  Influenza-like illness  Encounter for laboratory testing for COVID-19 virus    Rx / DC Orders ED Discharge Orders    None       Renne Crigler, PA-C 01/29/20 2036    Virgina Norfolk, DO 01/29/20 2042

## 2020-01-29 NOTE — ED Notes (Signed)
ED Provider at bedside. 

## 2020-01-29 NOTE — Discharge Instructions (Signed)
Please read and follow all provided instructions.  Your diagnoses today include:  1. Influenza-like illness   2. Encounter for laboratory testing for COVID-19 virus     Tests performed today include:  Vital signs. See below for your results today.   COVID test - pending, check mychart for results  Medications prescribed:   None  Take any prescribed medications only as directed. Treatment for your infection is aimed at treating the symptoms. There are no medications, such as antibiotics, that will cure your infection.   Home care instructions:  Follow any educational materials contained in this packet.   Your illness is contagious and can be spread to others, especially during the first 3 or 4 days. It cannot be cured by antibiotics or other medicines. Take basic precautions such as washing your hands often, covering your mouth when you cough or sneeze, and avoiding public places where you could spread your illness to others.   Please continue drinking plenty of fluids.  Use over-the-counter medicines as needed as directed on packaging for symptom relief.  You may also use ibuprofen or tylenol as directed on packaging for pain or fever.  Do not take multiple medicines containing Tylenol or acetaminophen to avoid taking too much of this medication.  If you are positive for Covid-19, you should isolate yourself and not be exposed to other people for 5 days after your symptoms began. If you are not feeling better at day 5, you need to isolate yourself for a total of 10 days. If you are feeling better by day 5, you should wear a mask properly, over your nose and mouth, at all times while around other people until 10 days after your symptoms started.   Follow-up instructions: Please follow-up with your primary care provider as needed for further evaluation of your symptoms if you are not feeling better.   Return instructions:   Please return to the Emergency Department if you experience  worsening symptoms.   Return to the emergency department if you have worsening shortness of breath breathing or increased work of breathing, persistent vomiting  RETURN IMMEDIATELY IF you develop shortness of breath, confusion or altered mental status, a new rash, become dizzy, faint, or poorly responsive, or are unable to be cared for at home.  Please return if you have persistent vomiting and cannot keep down fluids or develop a fever that is not controlled by tylenol or motrin.    Please return if you have any other emergent concerns.  Additional Information:  Your vital signs today were: BP (!) 131/92 (BP Location: Right Arm)    Pulse (!) 101    Temp 98.6 F (37 C) (Oral)    Resp 18    Ht 5\' 1"  (1.549 m)    Wt 58.1 kg    SpO2 100%    BMI 24.19 kg/m  If your blood pressure (BP) was elevated above 135/85 this visit, please have this repeated by your doctor within one month. --------------

## 2020-01-29 NOTE — ED Notes (Signed)
EKG given to Dr Lockie Mola.  No other orders received at this time.

## 2020-01-29 NOTE — ED Triage Notes (Signed)
Reports chest pain, headache, going between hot and cold, body aches, and cough that started today.

## 2020-01-30 LAB — SARS CORONAVIRUS 2 (TAT 6-24 HRS): SARS Coronavirus 2: POSITIVE — AB

## 2020-01-31 ENCOUNTER — Telehealth: Payer: Self-pay | Admitting: Physician Assistant

## 2020-01-31 ENCOUNTER — Telehealth: Payer: Self-pay

## 2020-01-31 NOTE — Telephone Encounter (Signed)
Called to discuss with Clarene Reamer about Covid symptoms and the use of sotrovimab, remdisivir or oral therapies for those with mild to moderate Covid symptoms and at a high risk of hospitalization.     Pt is qualified due to co-morbid conditions and/or a member of an at-risk group (asthma, high SVI), however declines infusion at this time. Symptoms tier reviewed as well as criteria for ending isolation.  Symptoms reviewed that would warrant ED/Hospital evaluation. Preventative practices reviewed. Patient verbalized understanding. Patient advised to call back if he decides that he does want to get infusion. Callback number to the infusion center given. Patient advised to go to Urgent care or ED with severe symptoms. Last date pt would be eligible for infusion is 1/11.     There are no problems to display for this patient.   Cline Crock PA-C

## 2020-01-31 NOTE — Telephone Encounter (Signed)
I connected by phone with Jacqueline Hill on 01/31/2020 at 9:29 AM to discuss the potential use of a new treatment for mild to moderate COVID-19 viral infection in non-hospitalized patients.  This patient is a 35 y.o. female that meets the FDA criteria for Emergency Use Authorization of COVID monoclonal antibody sotrovimab.  Has a (+) direct SARS-CoV-2 viral test result  Has mild or moderate COVID-19   Is NOT hospitalized due to COVID-19  Is within 10 days of symptom onset  Has at least one of the high risk factor(s) for progression to severe COVID-19 and/or hospitalization as defined in EUA.  Specific high risk criteria : Chronic Lung Disease   I have spoken and communicated the following to the patient or parent/caregiver regarding COVID monoclonal antibody treatment:  1. FDA has authorized the emergency use for the treatment of mild to moderate COVID-19 in adults and pediatric patients with positive results of direct SARS-CoV-2 viral testing who are 86 years of age and older weighing at least 40 kg, and who are at high risk for progressing to severe COVID-19 and/or hospitalization.  2. The significant known and potential risks and benefits of COVID monoclonal antibody, and the extent to which such potential risks and benefits are unknown.  3. Information on available alternative treatments and the risks and benefits of those alternatives, including clinical trials.  4. Patients treated with COVID monoclonal antibody should continue to self-isolate and use infection control measures (e.g., wear mask, isolate, social distance, avoid sharing personal items, clean and disinfect "high touch" surfaces, and frequent handwashing) according to CDC guidelines.   5. The patient or parent/caregiver has the option to accept or refuse COVID monoclonal antibody treatment.  After reviewing this information with the patient, the patient has agreed to receive one of the available covid 19 monoclonal  antibodies and will be provided an appropriate fact sheet prior to infusion. Jacqueline Hardy, RN 01/31/2020 9:29 AM  Pt. Reports she started having cough,chest pain,chills,headache 01/26/20. Seen in ED 01/29/20.

## 2021-07-13 ENCOUNTER — Encounter (HOSPITAL_BASED_OUTPATIENT_CLINIC_OR_DEPARTMENT_OTHER): Payer: Self-pay | Admitting: Emergency Medicine

## 2021-07-13 ENCOUNTER — Emergency Department (HOSPITAL_BASED_OUTPATIENT_CLINIC_OR_DEPARTMENT_OTHER)
Admission: EM | Admit: 2021-07-13 | Discharge: 2021-07-14 | Disposition: A | Discharge status: Home / Self Care | Payer: No Typology Code available for payment source | Attending: Emergency Medicine | Admitting: Emergency Medicine

## 2021-07-13 ENCOUNTER — Other Ambulatory Visit: Payer: Self-pay

## 2021-07-13 DIAGNOSIS — K0889 Other specified disorders of teeth and supporting structures: Secondary | ICD-10-CM | POA: Diagnosis present

## 2021-07-13 DIAGNOSIS — J45909 Unspecified asthma, uncomplicated: Secondary | ICD-10-CM | POA: Insufficient documentation

## 2021-07-13 DIAGNOSIS — Z79899 Other long term (current) drug therapy: Secondary | ICD-10-CM | POA: Diagnosis not present

## 2021-07-13 DIAGNOSIS — K029 Dental caries, unspecified: Secondary | ICD-10-CM | POA: Diagnosis not present

## 2021-07-13 MED ORDER — AMOXICILLIN-POT CLAVULANATE 875-125 MG PO TABS
1.0000 | ORAL_TABLET | Freq: Once | ORAL | Status: AC
  Administered 2021-07-13: 1 via ORAL
  Filled 2021-07-13: qty 1

## 2021-07-13 MED ORDER — LIDOCAINE VISCOUS HCL 2 % MT SOLN: 15.0000 mL | OROMUCOSAL | 0 refills | Status: DC | PRN

## 2021-07-13 MED ORDER — ACETAMINOPHEN 500 MG PO TABS: 1000.0000 mg | ORAL_TABLET | Freq: Three times a day (TID) | ORAL | 0 refills | Status: AC | PRN

## 2021-07-13 MED ORDER — IBUPROFEN 600 MG PO TABS: 600.0000 mg | ORAL_TABLET | Freq: Four times a day (QID) | ORAL | 0 refills | Status: DC | PRN

## 2021-07-13 MED ORDER — AMOXICILLIN-POT CLAVULANATE 875-125 MG PO TABS: 1.0000 | ORAL_TABLET | Freq: Two times a day (BID) | ORAL | 0 refills | Status: DC

## 2021-07-13 MED ORDER — LIDOCAINE VISCOUS HCL 2 % MT SOLN
15.0000 mL | Freq: Once | OROMUCOSAL | Status: AC
  Administered 2021-07-13: 15 mL via OROMUCOSAL
  Filled 2021-07-13: qty 15

## 2021-07-13 MED ORDER — CHLORHEXIDINE GLUCONATE 0.12 % MT SOLN: 15.0000 mL | Freq: Two times a day (BID) | OROMUCOSAL | 0 refills | Status: DC

## 2021-07-13 MED ORDER — OXYCODONE-ACETAMINOPHEN 5-325 MG PO TABS
1.0000 | ORAL_TABLET | Freq: Once | ORAL | Status: AC
  Administered 2021-07-13: 1 via ORAL
  Filled 2021-07-13: qty 1

## 2021-07-13 NOTE — ED Provider Notes (Signed)
[facility] EMERGENCY DEPARTMENT Provider Note   CSN: [id number] Arrival date & time: 07/13/21  2113     History  Chief Complaint  Patient presents with   Dental Pain    Jacqueline Hill is a 36 y.o. female with a history of asthma, seasonal allergies.  Presents to the emergency department with a chief complaint of dental pain.  Patient reports that pain started this morning 5 AM.  Pain has been constant since then.  Pain is worse with p.o. intake and touch.  Patient has not tried any medications to alleviate her symptoms.  Patient rates pain 10/10 on the pain scale.  Denies any recent dental injury, fever, chills, facial swelling, trouble swallowing, trouble breathing, neck pain, neck stiffness, trismus, drooling, hot potato voice.   Dental Pain Associated symptoms: no drooling, no facial swelling, no fever and no neck pain        Home Medications Prior to Admission medications   Medication Sig Start Date End Date Taking? Authorizing Provider  albuterol (VENTOLIN HFA) 108 (90 Base) MCG/ACT inhaler Inhale into the lungs every 6 (six) hours as needed for wheezing or shortness of breath.    [provider]  loratadine (CLARITIN) 10 MG tablet Take 1 tablet (10 mg total) by mouth daily. 04/08/19   Anders Simmonds, PA-C  Multiple Vitamin (MULTIVITAMIN ADULT PO) Take by mouth.    [provider]  oxyCODONE (OXY IR/ROXICODONE) 5 MG immediate release tablet Take 1 tablet (5 mg total) by mouth every 6 (six) hours as needed. 11/16/19   Emelia Loron, MD      Allergies    Patient has no known allergies.    Review of Systems   Review of Systems  Constitutional:  Negative for chills and fever.  HENT:  Positive for dental problem. Negative for drooling, facial swelling, sore throat, trouble swallowing and voice change.   Eyes:  Negative for visual disturbance.  Respiratory:  Negative for shortness of breath.   Gastrointestinal:  Negative for nausea and  vomiting.  Musculoskeletal:  Negative for neck pain and neck stiffness.    Physical Exam Updated Vital Signs BP (!) 128/94 (BP Location: Right Arm)   Pulse 83   Temp 98.8 F (37.1 C) (Oral)   Resp 18   Wt 57.2 kg   LMP 06/21/2021   SpO2 100%   BMI 23.81 kg/m  Physical Exam Vitals and nursing note reviewed.  Constitutional:      General: She is not in acute distress.    Appearance: She is not ill-appearing, toxic-appearing or diaphoretic.  HENT:     Head: Normocephalic. No right periorbital erythema or left periorbital erythema.     Jaw: No trismus, tenderness, swelling, pain on movement or malocclusion.     Mouth/Throat:     Lips: Pink. No lesions.     Mouth: Mucous membranes are moist. No angioedema.     Dentition: Abnormal dentition. Dental tenderness and dental caries present. No dental abscesses.     Pharynx: Oropharynx is clear. Uvula midline. No pharyngeal swelling, oropharyngeal exudate, posterior oropharyngeal erythema or uvula swelling.     Tonsils: No tonsillar exudate or tonsillar abscesses. 1+ on the right. 1+ on the left.     Comments: Poor dentition with multiple missing teeth and dental caries.  Dental tenderness to #1 and #32 tooth.  #1 tooth #32 teeth have extensive dental caries with dental erosion.  Handles oral secretions without difficulty.  No swelling to floor of patient's mouth. Eyes:  General: No scleral icterus.       Right eye: No discharge.        Left eye: No discharge.  Neck:     Comments: No swelling to the submandibular space Cardiovascular:     Rate and Rhythm: Normal rate.  Pulmonary:     Effort: Pulmonary effort is normal.  Musculoskeletal:     Cervical back: Normal range of motion and neck supple. No edema, erythema, signs of trauma, rigidity, torticollis or crepitus. No pain with movement, spinous process tenderness or muscular tenderness. Normal range of motion.  Skin:    General: Skin is warm and dry.  Neurological:     General:  No focal deficit present.     Mental Status: She is alert.  Psychiatric:        Behavior: Behavior is cooperative.     ED Results / Procedures / Treatments   Labs (all labs ordered are listed, but only abnormal results are displayed) Labs Reviewed - No data to display  EKG None  Radiology No results found.  Procedures Procedures    Medications Ordered in ED Medications  oxyCODONE-acetaminophen (PERCOCET/ROXICET) 5-325 MG per tablet 1 tablet (has no administration in time range)  amoxicillin-clavulanate (AUGMENTIN) 875-125 MG per tablet 1 tablet (has no administration in time range)  lidocaine (XYLOCAINE) 2 % viscous mouth solution 15 mL (has no administration in time range)    ED Course/ Medical Decision Making/ A&P                           Medical Decision Making Risk OTC drugs. Prescription drug management.   Alert 36 year old female in no acute distress, nontoxic-appearing.  Presents the emergency department complaint dental pain.    Information obtained from patient and patient's companion at bedside.  Past medical records were reviewed including previous provider notes, labs, and imaging.    No signs of Ludewig's angina or deep space neck infection.  With dental tenderness and teeth showing signs of dental caries with severe erosion concern for dental infection.  We will start patient on course of Augmentin.  Will additionally prescribe patient with Peridex and viscous lidocaine.  Patient to be prescribed Tylenol and ibuprofen for pain management.  Importance of dental follow-up was stressed to the patient.  Based on patient's chief complaint, I considered admission might be necessary, however after reassuring ED workup feel patient is reasonable for discharge.  Discussed results, findings, treatment and follow up. Patient advised of return precautions. Patient verbalized understanding and agreed with plan.  Portions of this note were generated with Administrator, sports. Dictation errors may occur despite best attempts at proofreading.         Final Clinical Impression(s) / ED Diagnoses Final diagnoses:  Pain, dental    Rx / DC Orders ED Discharge Orders          Ordered    amoxicillin-clavulanate (AUGMENTIN) 875-125 MG tablet  Every 12 hours        07/13/21 2320    lidocaine (XYLOCAINE) 2 % solution  As needed        07/13/21 2320    chlorhexidine (PERIDEX) 0.12 % solution  2 times daily        07/13/21 2320    acetaminophen (TYLENOL) 500 MG tablet  Every 8 hours PRN        07/13/21 2320    ibuprofen (ADVIL) 600 MG tablet  Every 6 hours PRN  07/13/21 2320              Haskel Schroeder, PA-C 07/13/21 2327    Maia Plan, MD 07/18/21 1016

## 2021-11-15 ENCOUNTER — Encounter (HOSPITAL_BASED_OUTPATIENT_CLINIC_OR_DEPARTMENT_OTHER): Payer: Self-pay

## 2021-11-15 DIAGNOSIS — S7012XA Contusion of left thigh, initial encounter: Secondary | ICD-10-CM | POA: Insufficient documentation

## 2021-11-15 DIAGNOSIS — W109XXA Fall (on) (from) unspecified stairs and steps, initial encounter: Secondary | ICD-10-CM | POA: Diagnosis not present

## 2021-11-15 DIAGNOSIS — Z5321 Procedure and treatment not carried out due to patient leaving prior to being seen by health care provider: Secondary | ICD-10-CM | POA: Diagnosis not present

## 2021-11-15 NOTE — ED Triage Notes (Signed)
Pt has LT upper thigh pain after a fall last Saturday down stairs. Pt has large purple bruise to thigh. Pt reports pain is severe. Ambulatory with even and steady gait. Denies hitting head. Denies other sx.

## 2021-11-16 ENCOUNTER — Emergency Department (HOSPITAL_BASED_OUTPATIENT_CLINIC_OR_DEPARTMENT_OTHER)
Admission: EM | Admit: 2021-11-16 | Discharge: 2021-11-16 | Discharge status: Against Medical Advice | Payer: BLUE CROSS/BLUE SHIELD | Attending: Emergency Medicine | Admitting: Emergency Medicine

## 2021-11-16 NOTE — ED Notes (Signed)
Pt does not appear to be in lobby; not outside

## 2022-01-05 IMAGING — MG DIGITAL DIAGNOSTIC BILAT W/ TOMO W/ CAD
6 of 12 series · 6 of 36 positions shown · non-contrast
Comparison: Previous exam(s).

CLINICAL DATA: 34-year-old female with recurrent periareolar RIGHT
breast abscesses. New swelling and pain. Patient is currently under
the care of Dr. Andina at [REDACTED].

EXAM:
DIGITAL DIAGNOSTIC BILATERAL MAMMOGRAM WITH CAD AND TOMO
ULTRASOUND RIGHT BREAST

[L CC synth-2D]
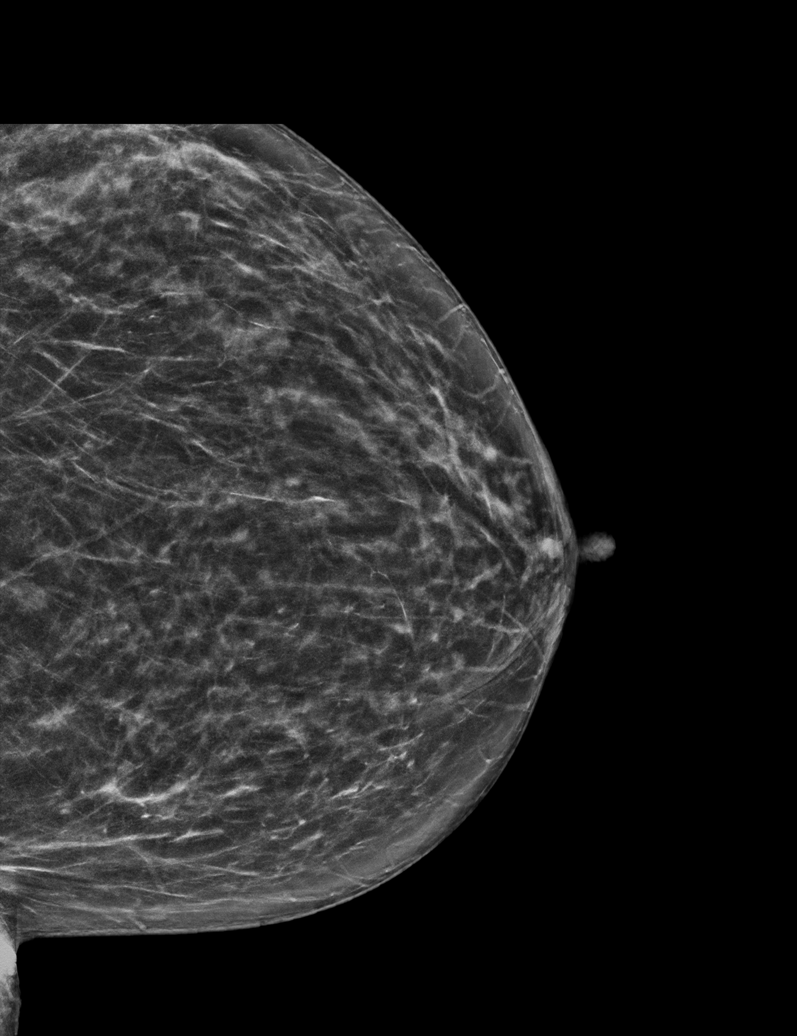

[R MLO synth-2D (1 of 2)]
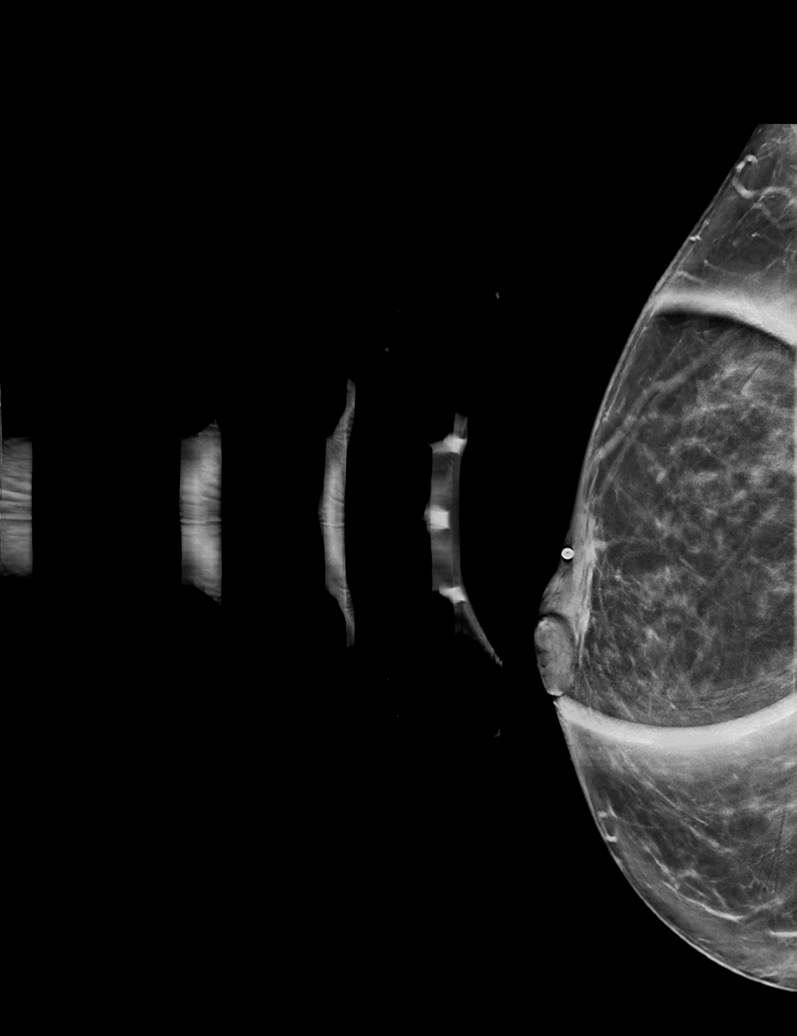

[R MLO synth-2D (2 of 2)]
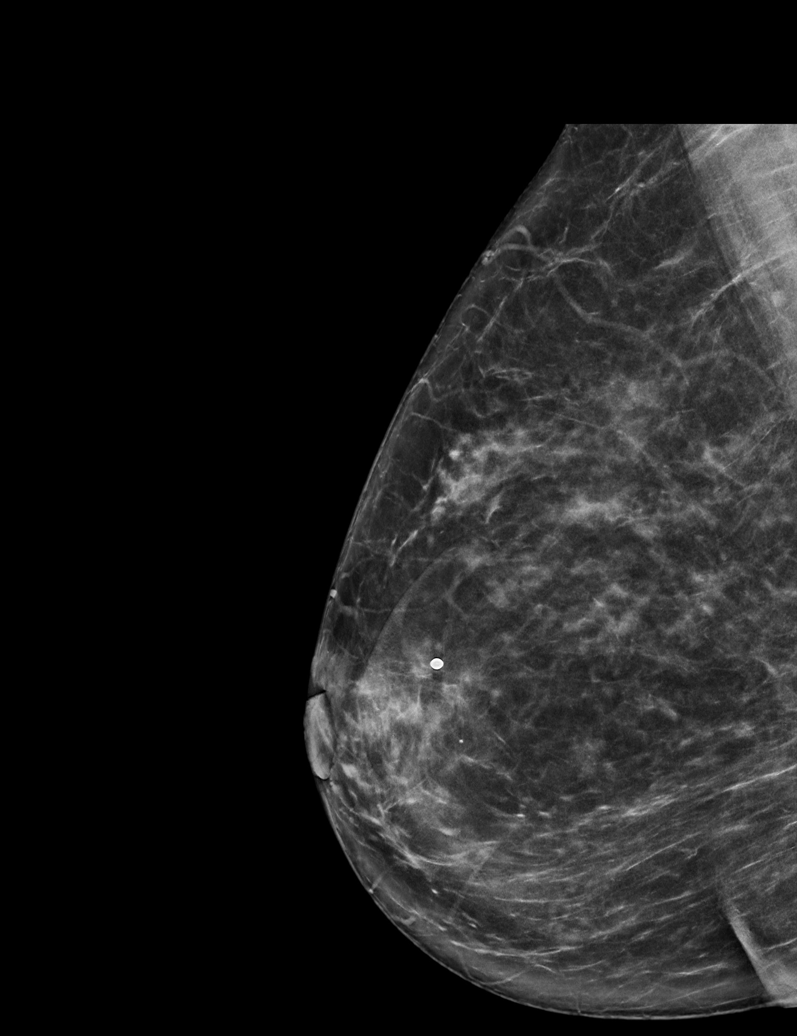

[R CC synth-2D]
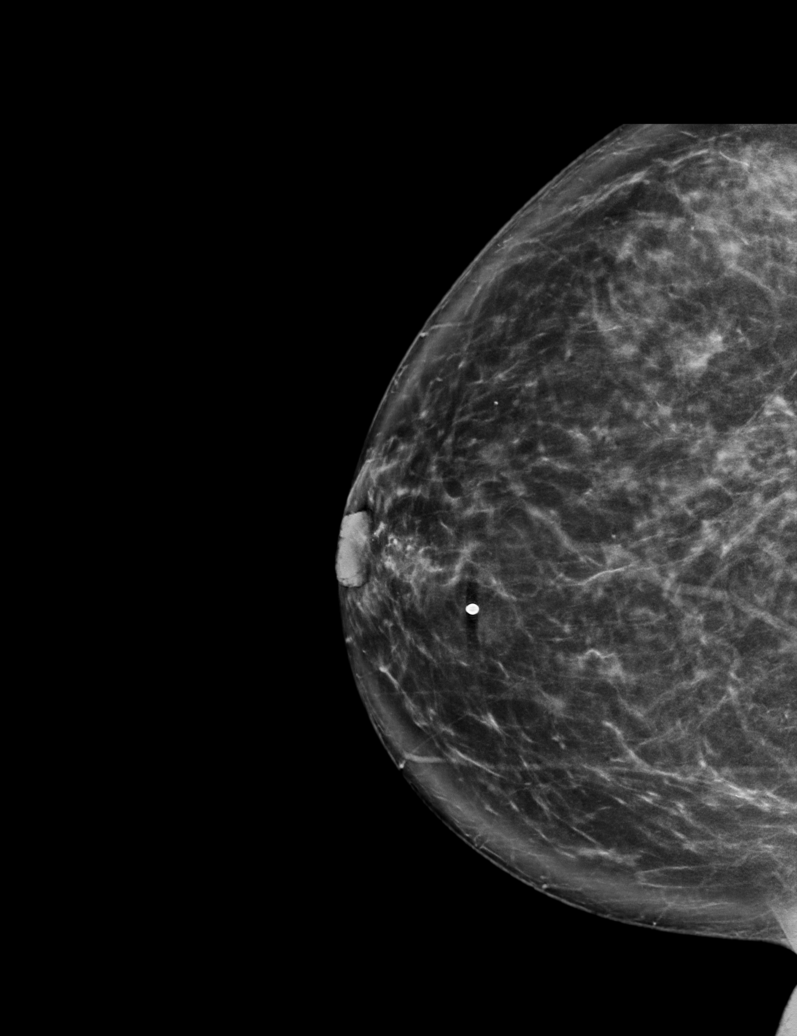

[L MLO synth-2D (1 of 2)]
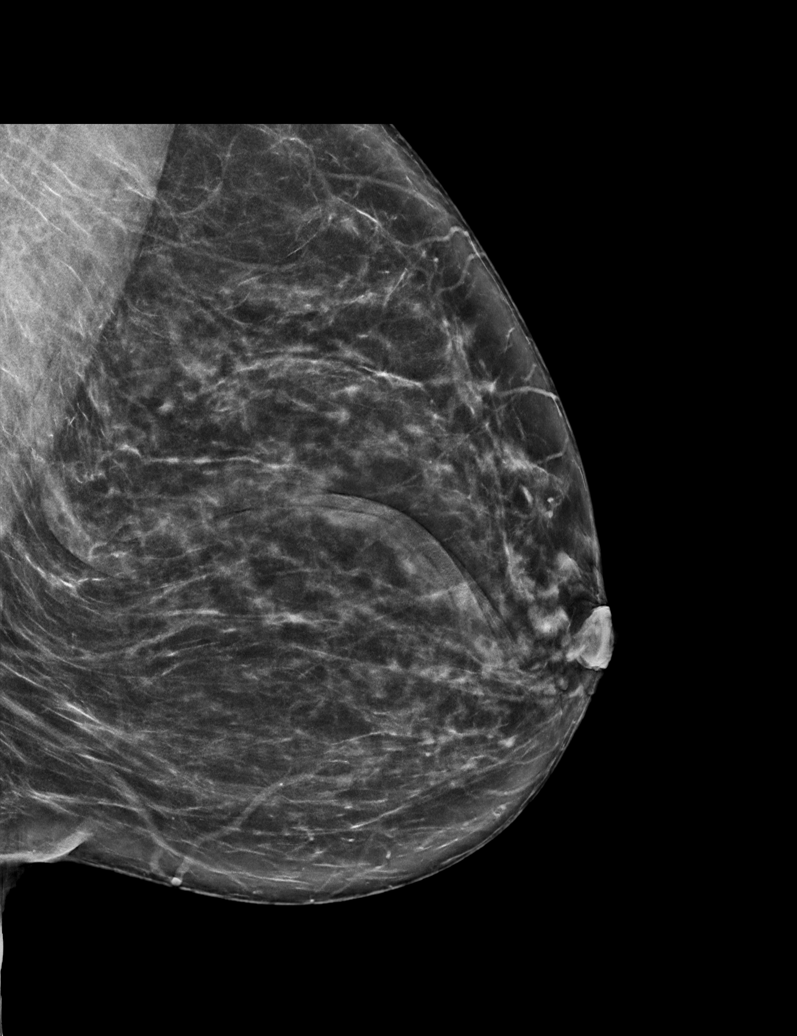

[L MLO synth-2D (2 of 2)]
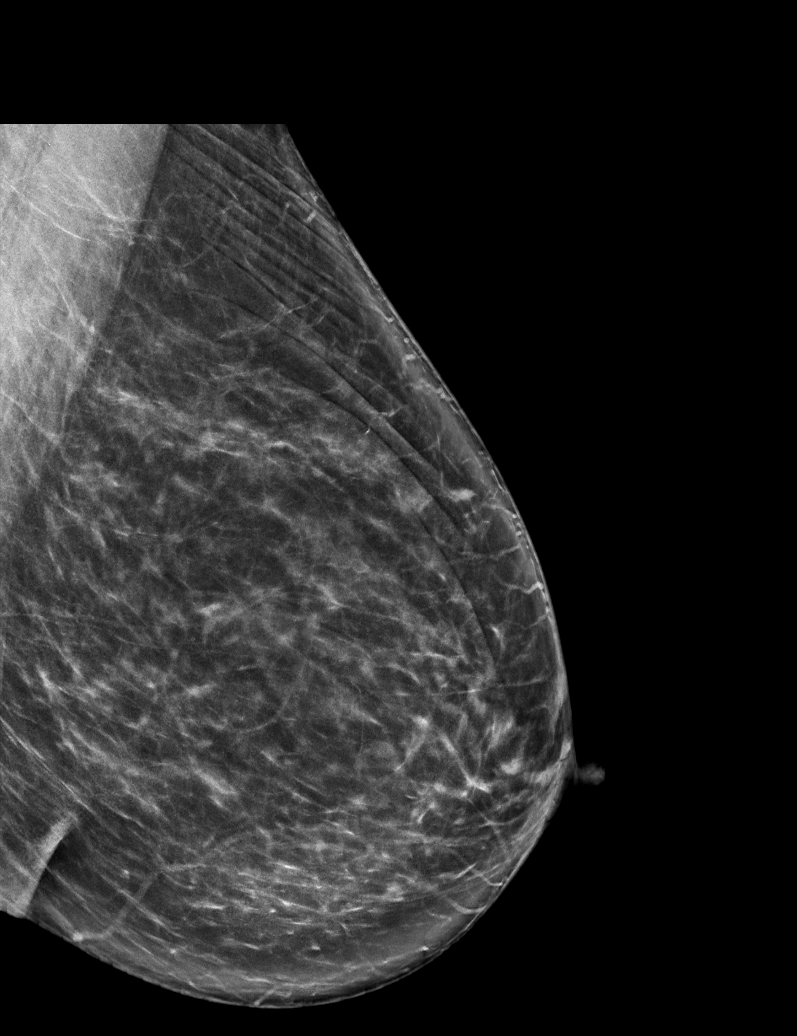

[6 of 36 positions shown; findings below may reference images not displayed]

ACR Breast Density Category b: There are scattered areas of
fibroglandular density.
FINDINGS: 2D/3D full field views of both breasts and spot compression view of
the RIGHT breast demonstrate increased density in the RETROAREOLAR
RIGHT breast. An enlarged RIGHT axillary lymph node is noted.

No discrete mass, suspicious distortion or worrisome calcifications
are noted within either breast.

Mammographic images were processed with CAD.

On physical exam, a skin wound is identified at the 1 o'clock
position of the RIGHT areola. Fullness underlying this area is
noted.

Targeted ultrasound is performed, showing a 2.7 x 0.9 x 2.3 cm
superficial collection in the 1 o'clock position of the RETROAREOLAR
RIGHT breast, extending to the skin at the site of patient wound.

A single enlarged RIGHT axillary lymph node with 0.5 cm cortical
thickening is noted, almost certainly reactive.
IMPRESSION: 1. 2.7 cm UPPER INNER RETROAREOLAR RIGHT breast collection/abscess.
The patient refuses needle aspiration and states that this will
drain on its own with warm compresses. She also desires surgical
treatment.
2. Enlarged RIGHT axillary lymph nodes, most certainly reactive.
Consider 3 month RIGHT axillary ultrasound follow-up to ensure
improvement.
3. No suspicious mammographic findings.

RECOMMENDATION:
Continue surgical/clinical follow-up.

RIGHT axillary ultrasound in 3 months for follow-up of almost
certainly benign/reactive RIGHT axillary lymph node.

I have discussed the findings and recommendations with the patient.
Smoking cessation was discussed with the patient. If applicable, a
reminder letter will be sent to the patient regarding the next
appointment.

BI-RADS CATEGORY  3: Probably benign (axillary lymph node).

## 2022-04-22 ENCOUNTER — Emergency Department (HOSPITAL_BASED_OUTPATIENT_CLINIC_OR_DEPARTMENT_OTHER)
Admission: EM | Admit: 2022-04-22 | Discharge: 2022-04-22 | Disposition: A | Discharge status: Home / Self Care | Payer: BLUE CROSS/BLUE SHIELD | Attending: Emergency Medicine | Admitting: Emergency Medicine

## 2022-04-22 ENCOUNTER — Encounter (HOSPITAL_BASED_OUTPATIENT_CLINIC_OR_DEPARTMENT_OTHER): Payer: Self-pay | Admitting: Emergency Medicine

## 2022-04-22 ENCOUNTER — Emergency Department (HOSPITAL_BASED_OUTPATIENT_CLINIC_OR_DEPARTMENT_OTHER): Payer: BLUE CROSS/BLUE SHIELD

## 2022-04-22 ENCOUNTER — Other Ambulatory Visit: Payer: Self-pay

## 2022-04-22 DIAGNOSIS — K529 Noninfective gastroenteritis and colitis, unspecified: Secondary | ICD-10-CM

## 2022-04-22 DIAGNOSIS — R109 Unspecified abdominal pain: Secondary | ICD-10-CM | POA: Diagnosis present

## 2022-04-22 LAB — CBC WITH DIFFERENTIAL/PLATELET
Abs Immature Granulocytes: 0.02 10*3/uL (ref 0.00–0.07)
Basophils Absolute: 0.1 10*3/uL (ref 0.0–0.1)
Basophils Relative: 2 %
Eosinophils Absolute: 0.7 10*3/uL — ABNORMAL HIGH (ref 0.0–0.5)
Eosinophils Relative: 16 %
HCT: 40 % (ref 36.0–46.0)
Hemoglobin: 13.1 g/dL (ref 12.0–15.0)
Immature Granulocytes: 1 %
Lymphocytes Relative: 41 %
Lymphs Abs: 1.8 10*3/uL (ref 0.7–4.0)
MCH: 27.6 pg (ref 26.0–34.0)
MCHC: 32.8 g/dL (ref 30.0–36.0)
MCV: 84.4 fL (ref 80.0–100.0)
Monocytes Absolute: 0.5 10*3/uL (ref 0.1–1.0)
Monocytes Relative: 12 %
Neutro Abs: 1.2 10*3/uL — ABNORMAL LOW (ref 1.7–7.7)
Neutrophils Relative %: 28 %
Platelets: 258 10*3/uL (ref 150–400)
RBC: 4.74 MIL/uL (ref 3.87–5.11)
RDW: 15.5 % (ref 11.5–15.5)
WBC: 4.3 10*3/uL (ref 4.0–10.5)
nRBC: 0 % (ref 0.0–0.2)

## 2022-04-22 LAB — COMPREHENSIVE METABOLIC PANEL
ALT: 14 U/L (ref 0–44)
AST: 26 U/L (ref 15–41)
Albumin: 4.1 g/dL (ref 3.5–5.0)
Alkaline Phosphatase: 71 U/L (ref 38–126)
Anion gap: 8 (ref 5–15)
BUN: 6 mg/dL (ref 6–20)
CO2: 25 mmol/L (ref 22–32)
Calcium: 8.7 mg/dL — ABNORMAL LOW (ref 8.9–10.3)
Chloride: 106 mmol/L (ref 98–111)
Creatinine, Ser: 0.59 mg/dL (ref 0.44–1.00)
GFR, Estimated: >60 mL/min (ref >60)
Glucose, Bld: 98 mg/dL (ref 70–99)
Potassium: 3.6 mmol/L (ref 3.5–5.1)
Sodium: 139 mmol/L (ref 135–145)
Total Bilirubin: 0.2 mg/dL — ABNORMAL LOW (ref 0.3–1.2)
Total Protein: 7.5 g/dL (ref 6.5–8.1)

## 2022-04-22 LAB — LIPASE, BLOOD: Lipase: 24 U/L (ref 11–51)

## 2022-04-22 LAB — HCG, SERUM, QUALITATIVE: Preg, Serum: NEGATIVE

## 2022-04-22 MED ORDER — SODIUM CHLORIDE 0.9 % IV BOLUS
1000.0000 mL | Freq: Once | INTRAVENOUS | Status: AC
  Administered 2022-04-22: 1000 mL via INTRAVENOUS

## 2022-04-22 MED ORDER — FENTANYL CITRATE PF 50 MCG/ML IJ SOSY
50.0000 ug | PREFILLED_SYRINGE | Freq: Once | INTRAMUSCULAR | Status: AC
  Administered 2022-04-22: 50 ug via INTRAVENOUS
  Filled 2022-04-22: qty 1

## 2022-04-22 MED ORDER — ONDANSETRON HCL 4 MG/2ML IJ SOLN
4.0000 mg | Freq: Once | INTRAMUSCULAR | Status: AC
  Administered 2022-04-22: 4 mg via INTRAVENOUS
  Filled 2022-04-22: qty 2

## 2022-04-22 MED ORDER — DICYCLOMINE HCL 20 MG PO TABS
20.0000 mg | ORAL_TABLET | Freq: Three times a day (TID) | ORAL | 0 refills | Status: AC | PRN
Start: 2022-04-22 — End: ?

## 2022-04-22 MED ORDER — ONDANSETRON 4 MG PO TBDP: 4.0000 mg | ORAL_TABLET | Freq: Three times a day (TID) | ORAL | 0 refills | Status: AC | PRN

## 2022-04-22 MED ORDER — IOHEXOL 350 MG/ML SOLN
100.0000 mL | Freq: Once | INTRAVENOUS | Status: AC | PRN
  Administered 2022-04-22: 100 mL via INTRAVENOUS

## 2022-04-22 NOTE — ED Notes (Signed)
Patient reports improvement in pain and nausea.

## 2022-04-22 NOTE — ED Triage Notes (Signed)
Reports intermittent episodes of sudden onset abdominal pain with n/v/d since yesterday. Pt unable to describe pain, when given examples pt states "all of the above" and rates pain 10/10. Pt states she tried drinking water "but my stomach still hurt." She also states "this feels like an ulcer" and states that she has a PMH of "stomach ulcer".

## 2022-04-22 NOTE — ED Provider Notes (Signed)
[facility]  Provider Note  CSN: [id number] Arrival date & time: 04/22/22 0439  History Chief Complaint  Patient presents with   Abdominal Pain    Jacqueline Hill is a 37 y.o. female with remote history of PUD (diagnosed at [facility] years ago but she doesn't remember how) here with 2 days of constant epigastric pain, associated with diarrhea. Denies N/V to me. No fever. No prior abdominal surgeries. She drinks alcohol regularly, no known history of hepatobiliary or pancreas problems. She does not take any medications, no known allergies. Denies drug use.    Home Medications Prior to Admission medications   Medication Sig Start Date End Date Taking? Authorizing Provider  dicyclomine (BENTYL) 20 MG tablet Take 1 tablet (20 mg total) by mouth 3 (three) times daily as needed (abdominal cramping). 04/22/22  Yes Truddie Hidden, MD  ondansetron (ZOFRAN-ODT) 4 MG disintegrating tablet Take 1 tablet (4 mg total) by mouth every 8 (eight) hours as needed for nausea or vomiting. 04/22/22  Yes Truddie Hidden, MD  acetaminophen (TYLENOL) 500 MG tablet Take 2 tablets (1,000 mg total) by mouth every 8 (eight) hours as needed. 07/13/21   Loni Beckwith, PA-C  albuterol (VENTOLIN HFA) 108 (90 Base) MCG/ACT inhaler Inhale into the lungs every 6 (six) hours as needed for wheezing or shortness of breath.    [provider]  loratadine (CLARITIN) 10 MG tablet Take 1 tablet (10 mg total) by mouth daily. 04/08/19   Argentina Donovan, PA-C  Multiple Vitamin (MULTIVITAMIN ADULT PO) Take by mouth.    [provider]     Allergies    Patient has no known allergies.   Review of Systems   Review of Systems Please see HPI for pertinent positives and negatives  Physical Exam BP (!) 131/101   Pulse (!) 102   Temp 97.9 F (36.6 C)   Resp 20   Ht 5\' 1"  (1.549 m)   Wt 56.7 kg   LMP 04/19/2022   SpO2 99%   BMI 23.62 kg/m    Physical Exam Vitals and nursing note reviewed.  Constitutional:      Appearance: Normal appearance.  HENT:     Head: Normocephalic and atraumatic.     Nose: Nose normal.     Mouth/Throat:     Mouth: Mucous membranes are moist.  Eyes:     Extraocular Movements: Extraocular movements intact.     Conjunctiva/sclera: Conjunctivae normal.  Cardiovascular:     Rate and Rhythm: Normal rate.  Pulmonary:     Effort: Pulmonary effort is normal.     Breath sounds: Normal breath sounds.  Abdominal:     General: Abdomen is flat.     Palpations: Abdomen is soft.     Tenderness: There is abdominal tenderness in the epigastric area. There is no guarding. Negative signs include Murphy's sign and McBurney's sign.  Musculoskeletal:        General: No swelling. Normal range of motion.     Cervical back: Neck supple.  Skin:    General: Skin is warm and dry.  Neurological:     General: No focal deficit present.     Mental Status: She is alert.  Psychiatric:        Mood and Affect: Mood normal.     ED Results / Procedures / Treatments   EKG None  Procedures Procedures  Medications Ordered in the ED Medications  fentaNYL (SUBLIMAZE) injection 50 mcg (  has no administration in time range)  fentaNYL (SUBLIMAZE) injection 50 mcg (50 mcg Intravenous Given 04/22/22 0520)  ondansetron (ZOFRAN) injection 4 mg (4 mg Intravenous Given 04/22/22 0519)  sodium chloride 0.9 % bolus 1,000 mL (0 mLs Intravenous Stopped 04/22/22 0632)  iohexol (OMNIPAQUE) 350 MG/ML injection 100 mL (100 mLs Intravenous Contrast Given 04/22/22 0629)    Initial Impression and Plan  Patient here with 2 days of epigastric pain. Consider pancreatitis, PUD, cholelithiasis, cholecystitis, SBO, colitis. Will check labs, send for CT. Pain/nausea meds for comfort.   ED Course   Clinical Course as of 04/22/22 0646  Mon Apr 22, 2022  0535 CBC is normal.  [CS]  [id number] CMP and lipase normal.  [CS]  0601 HCG is negative.  [CS]  [id number]  I personally viewed the images from radiology studies and agree with radiologist interpretation: CT shows uncomplicated enterocolitis. Patient reports pain has returned but she is feeling better and wants to try a PO trial. Otherwise her vitals are reassuring and labs are normal. Plan discharge with Rx for Zofran and Bentyl. PCP follow up, RTED for any other concerns.   [CS]    Clinical Course User Index [CS] Truddie Hidden, MD     MDM Rules/Calculators/A&P Medical Decision Making Given presenting complaint, I considered that admission might be necessary. After review of results from ED lab and/or imaging studies, admission to the hospital is not indicated at this time.    Problems Addressed: Enterocolitis: acute illness or injury  Amount and/or Complexity of Data Reviewed Labs: ordered. Decision-making details documented in ED Course. Radiology: ordered and independent interpretation performed. Decision-making details documented in ED Course.  Risk Prescription drug management. Parenteral controlled substances. Decision regarding hospitalization.     Final Clinical Impression(s) / ED Diagnoses Final diagnoses:  Enterocolitis    Rx / DC Orders ED Discharge Orders          Ordered    ondansetron (ZOFRAN-ODT) 4 MG disintegrating tablet  Every 8 hours PRN        04/22/22 0645    dicyclomine (BENTYL) 20 MG tablet  3 times daily PRN        04/22/22 0645             Truddie Hidden, MD 04/22/22 [phone]

## 2022-04-22 NOTE — ED Notes (Signed)
Patient reports improvement in pain.  Provided with apple juice per request.  Attempting PO at this time.

## 2022-04-25 ENCOUNTER — Other Ambulatory Visit: Payer: Self-pay

## 2022-04-25 ENCOUNTER — Emergency Department (HOSPITAL_BASED_OUTPATIENT_CLINIC_OR_DEPARTMENT_OTHER)
Admission: EM | Admit: 2022-04-25 | Discharge: 2022-04-25 | Disposition: A | Discharge status: Home / Self Care | Payer: BLUE CROSS/BLUE SHIELD | Attending: Emergency Medicine | Admitting: Emergency Medicine

## 2022-04-25 ENCOUNTER — Encounter (HOSPITAL_BASED_OUTPATIENT_CLINIC_OR_DEPARTMENT_OTHER): Payer: Self-pay

## 2022-04-25 DIAGNOSIS — R1084 Generalized abdominal pain: Secondary | ICD-10-CM | POA: Insufficient documentation

## 2022-04-25 DIAGNOSIS — R1013 Epigastric pain: Secondary | ICD-10-CM | POA: Diagnosis present

## 2022-04-25 DIAGNOSIS — R197 Diarrhea, unspecified: Secondary | ICD-10-CM | POA: Insufficient documentation

## 2022-04-25 DIAGNOSIS — R1110 Vomiting, unspecified: Secondary | ICD-10-CM | POA: Diagnosis not present

## 2022-04-25 LAB — COMPREHENSIVE METABOLIC PANEL
ALT: 17 U/L (ref 0–44)
AST: 35 U/L (ref 15–41)
Albumin: 4.3 g/dL (ref 3.5–5.0)
Alkaline Phosphatase: 68 U/L (ref 38–126)
Anion gap: 10 (ref 5–15)
BUN: 7 mg/dL (ref 6–20)
CO2: 22 mmol/L (ref 22–32)
Calcium: 9.1 mg/dL (ref 8.9–10.3)
Chloride: 103 mmol/L (ref 98–111)
Creatinine, Ser: 0.7 mg/dL (ref 0.44–1.00)
GFR, Estimated: >60 mL/min (ref >60)
Glucose, Bld: 104 mg/dL — ABNORMAL HIGH (ref 70–99)
Potassium: 3.2 mmol/L — ABNORMAL LOW (ref 3.5–5.1)
Sodium: 135 mmol/L (ref 135–145)
Total Bilirubin: 0.5 mg/dL (ref 0.3–1.2)
Total Protein: 7.7 g/dL (ref 6.5–8.1)

## 2022-04-25 LAB — CBC
HCT: 42.4 % (ref 36.0–46.0)
Hemoglobin: 14 g/dL (ref 12.0–15.0)
MCH: 27.8 pg (ref 26.0–34.0)
MCHC: 33 g/dL (ref 30.0–36.0)
MCV: 84.1 fL (ref 80.0–100.0)
Platelets: 217 10*3/uL (ref 150–400)
RBC: 5.04 MIL/uL (ref 3.87–5.11)
RDW: 15.4 % (ref 11.5–15.5)
WBC: 3.9 10*3/uL — ABNORMAL LOW (ref 4.0–10.5)
nRBC: 0 % (ref 0.0–0.2)

## 2022-04-25 LAB — LIPASE, BLOOD: Lipase: 25 U/L (ref 11–51)

## 2022-04-25 MED ORDER — PROCHLORPERAZINE EDISYLATE 10 MG/2ML IJ SOLN
10.0000 mg | Freq: Once | INTRAMUSCULAR | Status: AC
  Administered 2022-04-25: 10 mg via INTRAVENOUS
  Filled 2022-04-25: qty 2

## 2022-04-25 MED ORDER — PROCHLORPERAZINE MALEATE 10 MG PO TABS: 10.0000 mg | ORAL_TABLET | Freq: Two times a day (BID) | ORAL | 0 refills | Status: AC | PRN

## 2022-04-25 MED ORDER — ALUM & MAG HYDROXIDE-SIMETH 200-200-20 MG/5ML PO SUSP
30.0000 mL | Freq: Once | ORAL | Status: AC
  Administered 2022-04-25: 30 mL via ORAL
  Filled 2022-04-25: qty 30

## 2022-04-25 MED ORDER — LACTATED RINGERS IV BOLUS
1000.0000 mL | Freq: Once | INTRAVENOUS | Status: AC
  Administered 2022-04-25: 1000 mL via INTRAVENOUS

## 2022-04-25 NOTE — ED Notes (Signed)
Patient provided with urine cup and instructed to obtain sample. Patient states "I have not be able to urinate."

## 2022-04-25 NOTE — ED Provider Notes (Signed)
[facility]  Provider Note  CSN: [id number] Arrival date & time: 04/25/22 0150  History Chief Complaint  Patient presents with   Abdominal Pain    Jacqueline Hill is a 37 y.o. female remote history of PUD (diagnosed at [facility] hospital years ago but she doesn't remember how) here with 5 days of constant epigastric pain, associated with diarrhea. She was seen in the ED by myself on 4/1 with similar symptoms, denied vomiting then but reports she has been vomiting and having diarrhea after eating since leaving the hospital. Labs were normal at prior visit and CT was consistent with an enterocolitis. She was discharged with Rx for bentyl and zofran which she states is not working. She denies fever. No blood in vomit.    Home Medications Prior to Admission medications   Medication Sig Start Date End Date Taking? Authorizing Provider  prochlorperazine (COMPAZINE) 10 MG tablet Take 1 tablet (10 mg total) by mouth 2 (two) times daily as needed for nausea or vomiting. 04/25/22  Yes Truddie Hidden, MD  acetaminophen (TYLENOL) 500 MG tablet Take 2 tablets (1,000 mg total) by mouth every 8 (eight) hours as needed. 07/13/21   Loni Beckwith, PA-C  albuterol (VENTOLIN HFA) 108 (90 Base) MCG/ACT inhaler Inhale into the lungs every 6 (six) hours as needed for wheezing or shortness of breath.    [provider]  dicyclomine (BENTYL) 20 MG tablet Take 1 tablet (20 mg total) by mouth 3 (three) times daily as needed (abdominal cramping). 04/22/22   Truddie Hidden, MD  loratadine (CLARITIN) 10 MG tablet Take 1 tablet (10 mg total) by mouth daily. 04/08/19   Argentina Donovan, PA-C  Multiple Vitamin (MULTIVITAMIN ADULT PO) Take by mouth.    [provider]  ondansetron (ZOFRAN-ODT) 4 MG disintegrating tablet Take 1 tablet (4 mg total) by mouth every 8 (eight) hours as needed for nausea or vomiting. 04/22/22   Truddie Hidden, MD      Allergies    Patient has no known allergies.   Review of Systems   Review of Systems Please see HPI for pertinent positives and negatives  Physical Exam BP (!) 127/99 (BP Location: Right Arm)   Pulse 72   Temp 98.3 F (36.8 C) (Oral)   Resp 20   Ht 5\' 1"  (1.549 m)   Wt 54.4 kg   LMP 04/19/2022 (Exact Date)   SpO2 100%   BMI 22.67 kg/m   Physical Exam Vitals and nursing note reviewed.  Constitutional:      Appearance: Normal appearance.  HENT:     Head: Normocephalic and atraumatic.     Nose: Nose normal.     Mouth/Throat:     Mouth: Mucous membranes are moist.  Eyes:     Extraocular Movements: Extraocular movements intact.     Conjunctiva/sclera: Conjunctivae normal.  Cardiovascular:     Rate and Rhythm: Normal rate.  Pulmonary:     Effort: Pulmonary effort is normal.     Breath sounds: Normal breath sounds.  Abdominal:     General: Abdomen is flat.     Palpations: Abdomen is soft.     Tenderness: There is no abdominal tenderness. There is no guarding. Negative signs include Murphy's sign and McBurney's sign.  Musculoskeletal:        General: No swelling. Normal range of motion.     Cervical back: Neck supple.  Skin:    General: Skin is warm and  dry.  Neurological:     General: No focal deficit present.     Mental Status: She is alert.  Psychiatric:        Mood and Affect: Mood normal.     ED Results / Procedures / Treatments   EKG None  Procedures Procedures  Medications Ordered in the ED Medications  prochlorperazine (COMPAZINE) injection 10 mg (10 mg Intravenous Given 04/25/22 0350)  lactated ringers bolus 1,000 mL (0 mLs Intravenous Stopped 04/25/22 0528)  alum & mag hydroxide-simeth (MAALOX/MYLANTA) 200-200-20 MG/5ML suspension 30 mL (30 mLs Oral Given 04/25/22 0445)    Initial Impression and Plan  Patient here with persistent abdominal pain, vomiting and diarrhea. Exam is benign, vitals are reassuring. Labs done in triage show unremarkable  CBC, CMP and lipase. Will give nausea meds, IVF and reassess for improvement.   ED Course   Clinical Course as of 04/25/22 0534  Thu Apr 25, 2022  0531 Patient has been to the restroom several times but has not given a urine sample. She has not vomited since arrival. Plan PO trial and discharge with outpatient GI follow up.  [CS]    Clinical Course User Index [CS] Truddie Hidden, MD     MDM Rules/Calculators/A&P Medical Decision Making Problems Addressed: Generalized abdominal pain: acute illness or injury  Amount and/or Complexity of Data Reviewed Labs: ordered. Decision-making details documented in ED Course.  Risk OTC drugs. Prescription drug management.     Final Clinical Impression(s) / ED Diagnoses Final diagnoses:  Generalized abdominal pain    Rx / DC Orders ED Discharge Orders          Ordered    prochlorperazine (COMPAZINE) 10 MG tablet  2 times daily PRN        04/25/22 0533             Truddie Hidden, MD 04/25/22 [phone]

## 2022-04-25 NOTE — ED Notes (Signed)
Patient provide with apple juice for PO challenge

## 2022-04-25 NOTE — ED Notes (Signed)
Patient reports slight improvement in symptoms

## 2022-04-25 NOTE — ED Triage Notes (Signed)
Pt reports her stomach hurts and "not getting any better and I feel like I'm dehydrated." Pt states she was seen April 1st and having same sx. Pt reports she cannot eat.

## 2022-04-25 NOTE — ED Notes (Signed)
Patient reports she needed to go to bathroom.  Patient provided with additional urine cup.  Patient heard flushing and when comes out of bathroom, reported she was unable to provide sample at this time. Patient continues to state that she is unable to urinate.  Has not provided urine sample during past visits.  MD aware

## 2022-04-26 ENCOUNTER — Encounter: Payer: Self-pay | Admitting: Physician Assistant

## 2022-06-20 ENCOUNTER — Ambulatory Visit: Payer: BLUE CROSS/BLUE SHIELD | Admitting: Physician Assistant

## 2023-03-30 ENCOUNTER — Emergency Department (HOSPITAL_BASED_OUTPATIENT_CLINIC_OR_DEPARTMENT_OTHER)
Admission: EM | Admit: 2023-03-30 | Discharge: 2023-03-30 | Disposition: A | Discharge status: Home / Self Care | Attending: Emergency Medicine | Admitting: Emergency Medicine

## 2023-03-30 ENCOUNTER — Other Ambulatory Visit: Payer: Self-pay

## 2023-03-30 ENCOUNTER — Emergency Department (HOSPITAL_BASED_OUTPATIENT_CLINIC_OR_DEPARTMENT_OTHER)

## 2023-03-30 ENCOUNTER — Encounter (HOSPITAL_BASED_OUTPATIENT_CLINIC_OR_DEPARTMENT_OTHER): Payer: Self-pay | Admitting: Urology

## 2023-03-30 DIAGNOSIS — J45909 Unspecified asthma, uncomplicated: Secondary | ICD-10-CM | POA: Insufficient documentation

## 2023-03-30 DIAGNOSIS — M25561 Pain in right knee: Secondary | ICD-10-CM | POA: Diagnosis present

## 2023-03-30 DIAGNOSIS — F17210 Nicotine dependence, cigarettes, uncomplicated: Secondary | ICD-10-CM | POA: Insufficient documentation

## 2023-03-30 DIAGNOSIS — Z7951 Long term (current) use of inhaled steroids: Secondary | ICD-10-CM | POA: Insufficient documentation

## 2023-03-30 DIAGNOSIS — Z79899 Other long term (current) drug therapy: Secondary | ICD-10-CM | POA: Diagnosis not present

## 2023-03-30 DIAGNOSIS — M25522 Pain in left elbow: Secondary | ICD-10-CM | POA: Insufficient documentation

## 2023-03-30 NOTE — ED Notes (Signed)
 Patient transported to X-ray

## 2023-03-30 NOTE — Discharge Instructions (Signed)
 As discussed, your x-rays are negative for any fracture or dislocation.  Some concern that you may have a ligament injury in your right knee.  Wear brace and use crutches to help aid in getting around until you follow-up with orthopedics.  Call number attached your discharge papers to schedule an appointment.  Recommend rest, ice, ibuprofen for treatment of pain.  You may also elevate leg above the level of your heart to help with pain/swelling.  Please do not hesitate to return if the worrisome signs and symptoms we discussed become apparent.
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
## 2024-01-02 ENCOUNTER — Ambulatory Visit: Admitting: Internal Medicine
